# Patient Record
Sex: Female | Born: 1980 | Race: Black or African American | Hispanic: No
Health system: Southern US, Community
[De-identification: ages and names within clinical notes are randomized; demographics above are authoritative.]

## PROBLEM LIST (undated history)

## (undated) DIAGNOSIS — D649 Anemia, unspecified: Secondary | ICD-10-CM

## (undated) DIAGNOSIS — Z8639 Personal history of other endocrine, nutritional and metabolic disease: Secondary | ICD-10-CM

## (undated) HISTORY — PX: DILATION AND CURETTAGE OF UTERUS: SHX78

---
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
## 2008-03-20 ENCOUNTER — Inpatient Hospital Stay (HOSPITAL_COMMUNITY): Admission: AD | Admit: 2008-03-20 | Discharge: 2008-03-24 | Payer: Self-pay | Admitting: Obstetrics and Gynecology

## 2008-03-21 ENCOUNTER — Encounter (INDEPENDENT_AMBULATORY_CARE_PROVIDER_SITE_OTHER): Payer: Self-pay | Admitting: Obstetrics and Gynecology

## 2008-03-30 ENCOUNTER — Inpatient Hospital Stay (HOSPITAL_COMMUNITY): Admission: AD | Admit: 2008-03-30 | Discharge: 2008-03-30 | Payer: Self-pay | Admitting: Obstetrics and Gynecology

## 2008-08-25 IMAGING — US US PELVIS COMPLETE
1 series · 14 of 25 positions shown · non-contrast
Comparison: None

CLINICAL DATA: Cesarean section [DATE].  Vaginal bleeding.

TRANSABDOMINAL AND TRANSVAGINAL ULTRASOUND OF PELVIS
TECHNIQUE: Both transabdominal and transvaginal ultrasound
examinations of the pelvis were performed including evaluation of
the uterus, ovaries, adnexal regions, and pelvic cul-de-sac.

[Series 1: us pelvis complete · 0.28mm/px · 14 of 37 slices shown]
[im 1/37]
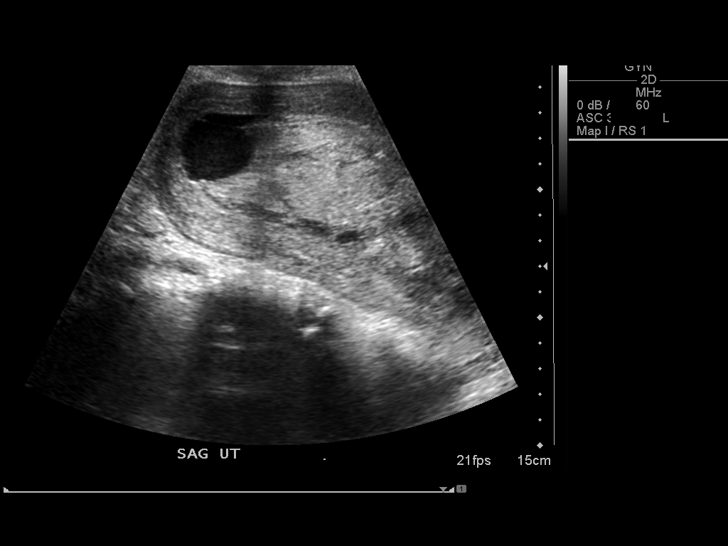
[im 4/37]
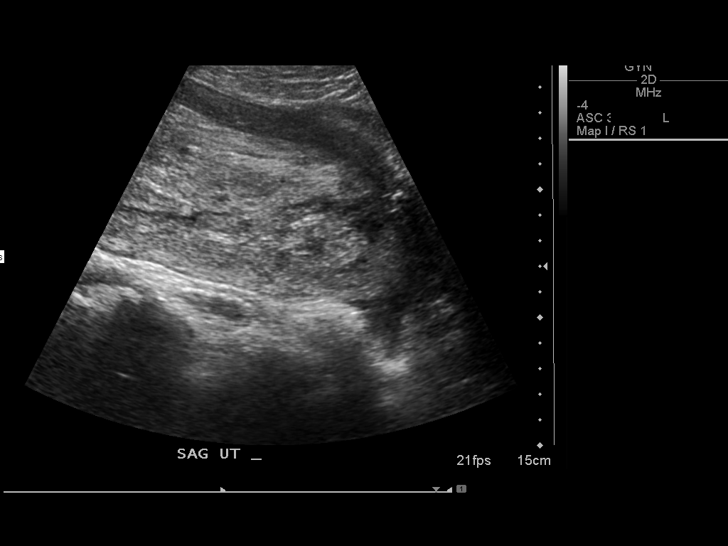
[im 7/37]
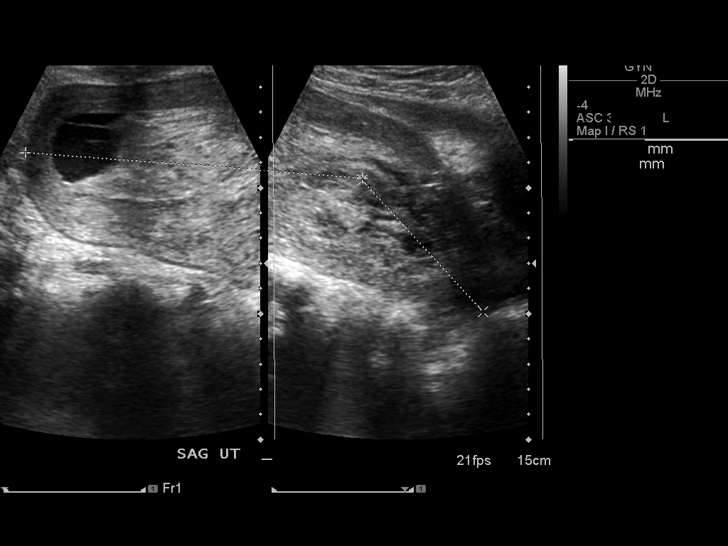
[im 10/37]
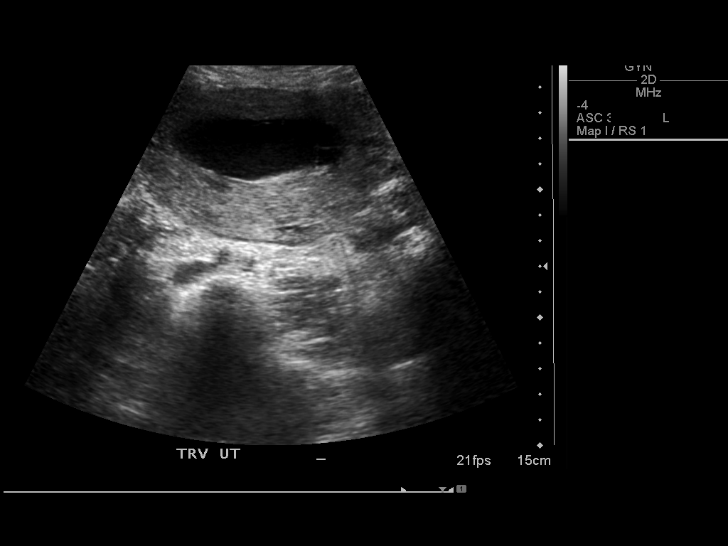
[im 13/37]
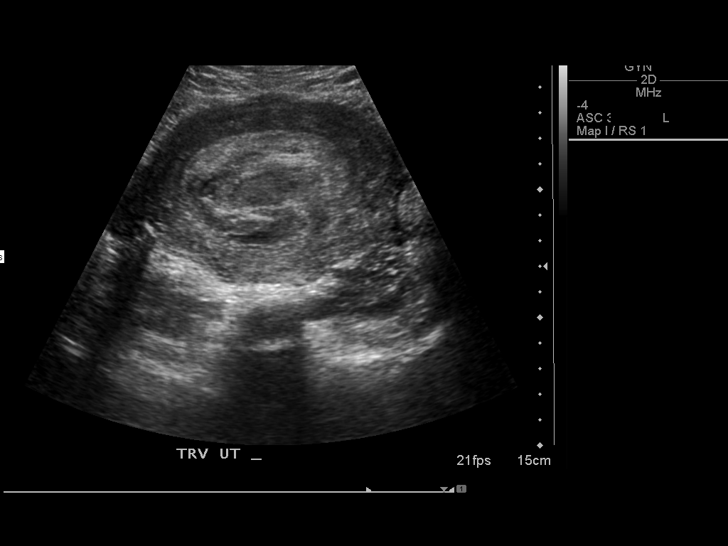
[im 14/37]
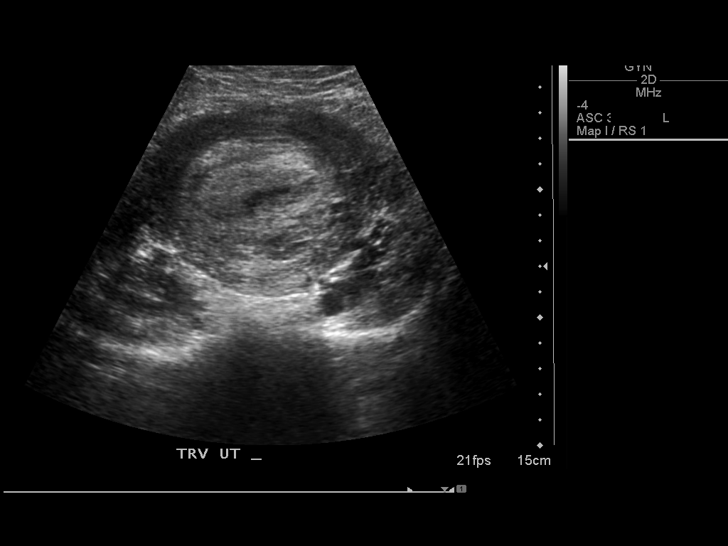
[im 17/37]
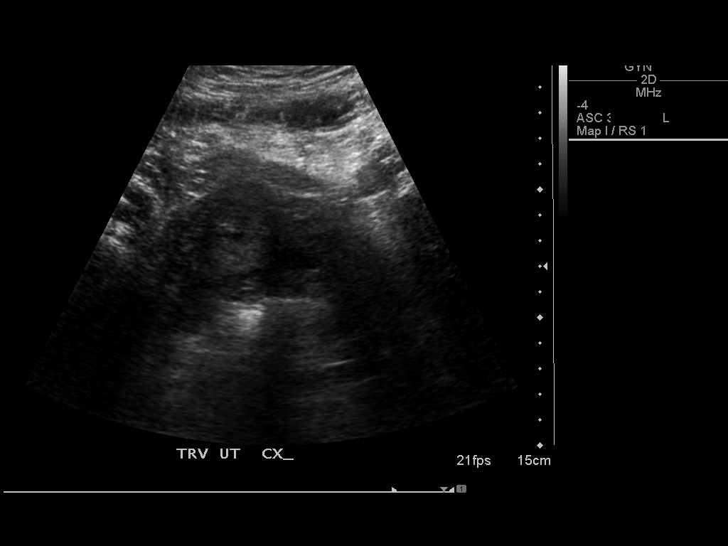
[im 20/37]
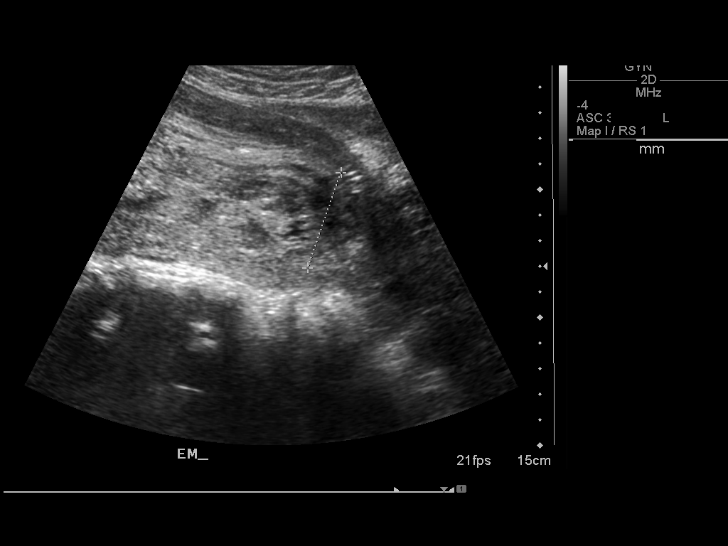
[im 23/37]
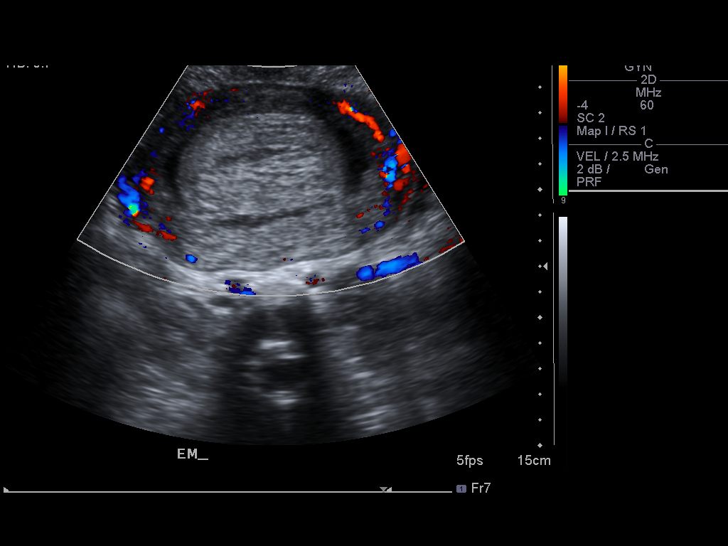
[im 25/37]
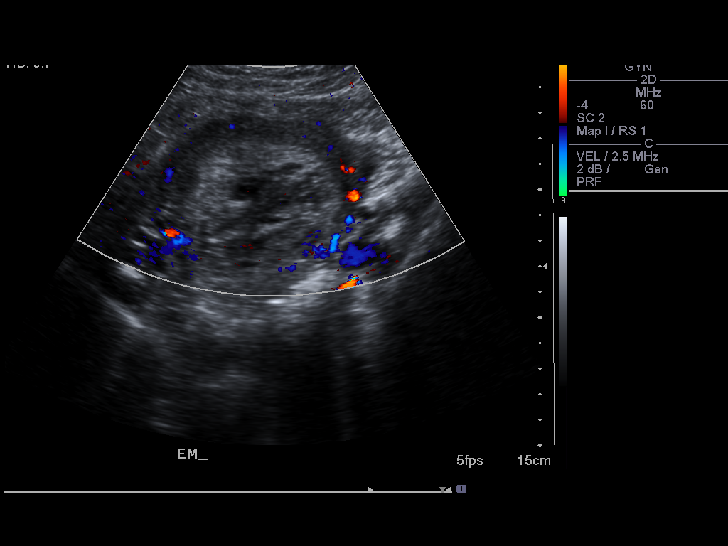
[im 28/37]
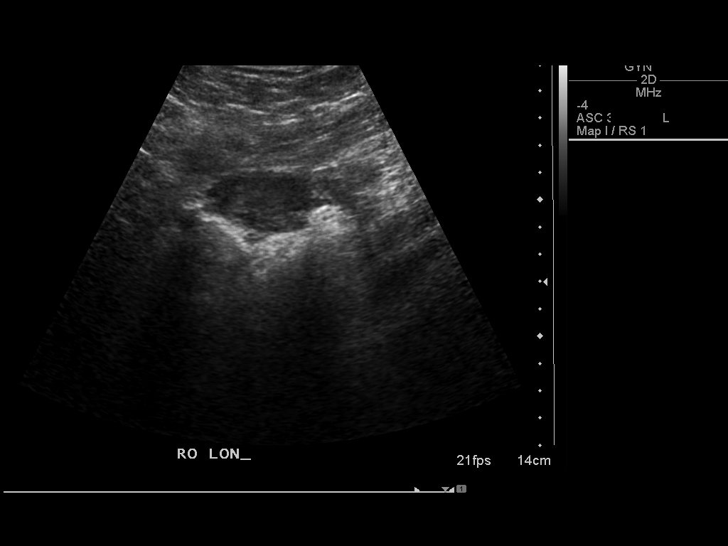
[im 31/37]
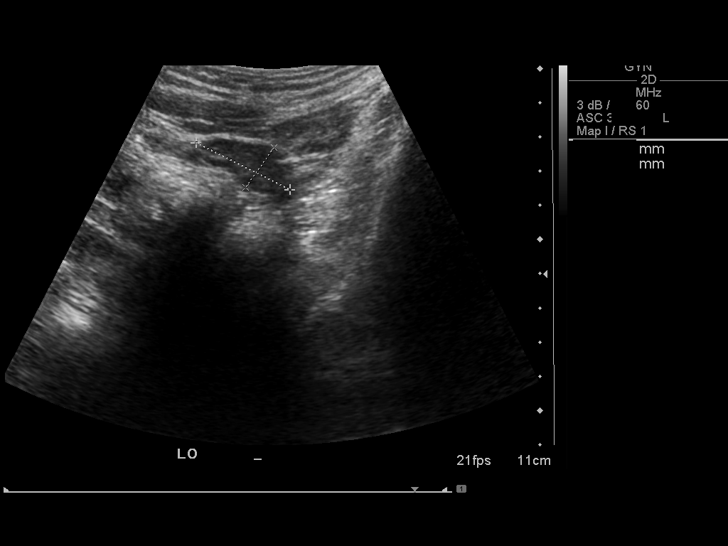
[im 34/37]
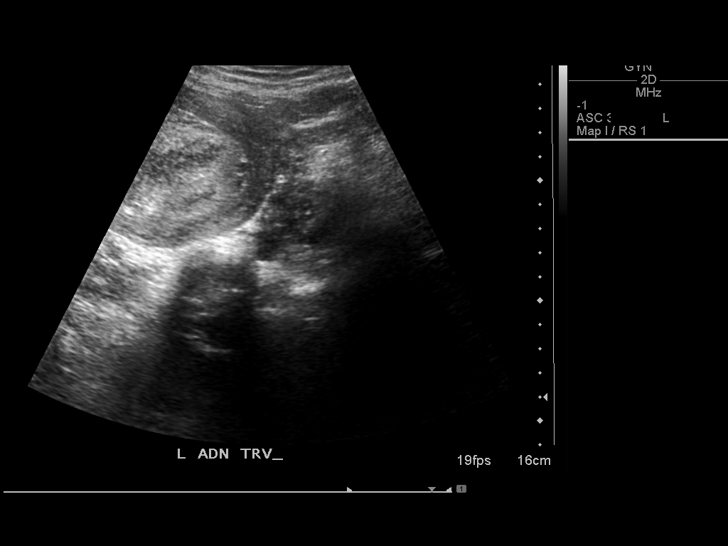
[im 37/37]
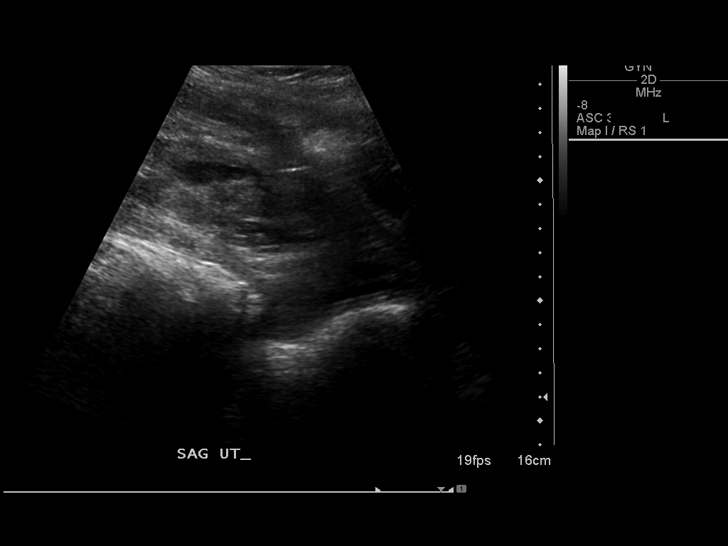

[14 of 25 positions shown; findings below may reference images not displayed]

FINDINGS: Uterus is enlarged measuring 20.4 x 6.4  x  9.8 cm.
Within the uterus is a approximately 4.8 cm thickness heterogeneous
avascular structure consistent with hematoma.  This has more
liquefied components superiorly.  No Doppler signal or movement to
suggest retained fetal parts.

Right ovary 3.8 x 2.3 x 3.2 cm.  Left ovary 3.1 x 1.4 x 2.1 cm.
Normal ovarian morphology without significant free fluid.
IMPRESSION: 1.  Large amount of residual hematoma within the uterus.  No
specific evidence to suggest retained products of conception.

## 2011-04-20 NOTE — Op Note (Signed)
Dorothy Logan, Dorothy Logan               ACCOUNT NO.:  [id number]   MEDICAL RECORD NO.:  [id number]          PATIENT TYPE:  INP   LOCATION:  9104                          FACILITY:  [facility]   PHYSICIAN:  Maxie Better, M.D.DATE OF BIRTH:  03/30/1981   DATE OF PROCEDURE:  03/21/2008  DATE OF DISCHARGE:                               OPERATIVE REPORT   PREOPERATIVE DIAGNOSIS:  Nonreassuring fetal tracing, post dates.   PROCEDURE:  Urgent primary cesarean section, Kerr hysterotomy.   POSTOPERATIVE DIAGNOSIS:  Nonreassuring fetal tracing, post dates.   ANESTHESIA:  Epidural.   SURGEON:  Maxie Better, M.D.   ASSISTANT:  None.   DESCRIPTION OF PROCEDURE:  Under adequate epidural anesthesia, the  patient is placed in the supine position with a left lateral tilt.  She  was quickly prepped and draped in the usual fashion.  An indwelling  Foley catheter was already in place.  0.25% Marcaine was injected along  the planned Pfannenstiel skin incision site and a skin incision was then  made and carried down to the rectus fascia.  The rectus fascia was then  opened transversely.  The rectus fascia was then bluntly and sharply  dissected off the rectus muscle in a superior and inferior fashion.  The  rectus muscles were split in the midline.  The parietal peritoneum was  entered bluntly and extended. The vesicouterine peritoneum was opened  transversely.  The lower uterine segment was well developed.  A low  transverse uterine incision was then made and extended with bandage  scissors.  Subsequent delivery of a live female via the assistance of  vacuum due to the large head was delivered.  The baby had a cord around  the neck x2 which were reduced.  The cord was clamped, cut, and the baby  was transferred to the awaiting pediatrician who assigned Apgars of 9  and 9 at 1 and 5 minutes.  The placenta had cord pH obtained which was  ultimately 7.27.  The placenta was removed and sent to pathology.   The  uterine cavity was cleaned of debris.  The uterine incision had no  extension.  The uterine incision was closed in two layers, the first  layer 0 Monocryl running locked stitch, the second layer imbricated  using 0 Monocryl suture.  The abdomen was then copiously irrigated and  suctioned of debris.  Normal tubes and ovaries were noted bilaterally.  The parietal peritoneum was then closed with 2-0 Vicryl, the rectus  fascia was closed with 0 Vicryl x2, the subcutaneous layers were  approximated with 2-0 plain sutures, and the skin approximated with  Ethicon staples.  The placenta was sent to pathology.  Estimated blood  loss was 600 mL.  Intraoperative fluid was 400 mL. Urine output was 50  mL.  Sponge and instrument counts x2 was correct.  Complications none.  Weight of the baby was 8 pounds 7 ounces.  The patient tolerated the  procedure well and was transferred to recovery in stable condition.     Maxie Better, M.D.  Electronically Signed    [place]/MEDQ  D:  03/21/2008  T:  03/21/2008  Job:  [id number]

## 2011-04-20 NOTE — Consult Note (Signed)
Dorothy, Logan               ACCOUNT NO.:  [id number]   MEDICAL RECORD NO.:  [id number]          PATIENT TYPE:  MAT   LOCATION:  MATC                          FACILITY:  [facility]   PHYSICIAN:  Lenoard Aden, M.D.DATE OF BIRTH:  [birth year]   DATE OF CONSULTATION:  03/30/2008  DATE OF DISCHARGE:                                 CONSULTATION   CHIEF COMPLAINT:  Increased bleeding.   She is a 30 year old female status post C-section March 21, 2008,  without complications.  Discharged home with a hemoglobin of a little  over 6 who presents with increased bleeding today.  She has allergy to  DEMEROL.  Medications are prenatal vitamins.  Family history of  migraine, alcohol abuse, breast cancer.  She is a nonsmoker, nondrinker,  denies domestic or physical violence.  She has had no other further  medical problems or hospitalization.  She is currently on an iron  supplement as well.   PHYSICAL EXAM:  She is a well-developed, well-nourished female in no  acute distress.  HEENT:  Normal.  LUNGS:  Clear.  HEART:  Regular rhythm.  ABDOMEN:  Soft, nontender.  Incision healing well.  PELVIC EXAM:  Reveals minimal active bleeding.  No adnexal masses.  EXTREMITIES:  Reveal no cords.  NEUROLOGIC:  Nonfocal.  SKIN:  Intact.   Ultrasound revealing no evidence of retained products, normal post  gravid uterus, no adnexal masses.  Hemoglobin today 7.8, up from  hospital discharge.   IMPRESSION:  Postpartum bleeding within normal limits.   PLAN:  Treat with Keflex 500 t.i.d. x7 days due to slightly increased  tenderness.  Will continue iron therapy and give Methergine 0.2 mg p.o.  q.4h. x6 doses.  Follow up in the office in 2-3 days.  Bleeding  precautions given.      Lenoard Aden, M.D.  Electronically Signed     [facility]/MEDQ  D:  03/30/2008  T:  03/30/2008  Job:  [id number]

## 2011-04-23 NOTE — Discharge Summary (Signed)
Dorothy Logan, Dorothy Logan               ACCOUNT NO.:  [id number]   MEDICAL RECORD NO.:  [id number]          PATIENT TYPE:  INP   LOCATION:  9104                          [facility]ILITY:  [facility]   PHYSICIAN:  Maxie Better, M.D.DATE OF BIRTH:  [birth year]   DATE OF ADMISSION:  03/20/2008  DATE OF DISCHARGE:  03/24/2008                               DISCHARGE SUMMARY   ADMISSION DIAGNOSIS:  Postdates.   DISCHARGE DIAGNOSES:  Postdates delivered, nonreassuring fetal tracing,  status post primary cesarean section.   PROCEDURE:  Primary cesarean section.   HISTORY OF PRESENT ILLNESS:  This is a patient of Dr. Ernestina Penna who is 30  years old admitted for induction of labor due to postdates.  The patient  had Cytotec for cervical ripening.  On March 21, 2008, she had  spontaneous rupture of membranes, meconium fluid.  At that time, her  exam was 3, 80%, minus 2.  An intrauterine pressure catheter was placed.  Tracing showed a baseline of 140 small variable to regular contractions  and infusion was started as was a low-dose Pitocin.  The patient began  having decelerations down to the 70s after each contractions and the  infusion was discontinued.  Pitocin was placed on hold.  Her exam at  that time was 4, 80, minus 1.  She had light appearing meconium.  Review  of the tracing showed some variable decelerations with some late  components continued, fetal heart rate down to the 70s lasting 60-70  seconds.  She was contracting every 3 to 4-1/2 minutes on her own.  We  felt it was variable deceleration secondary to cord compression.  The  patient subsequently had repetitive late variable decelerations with no  marked cervical change.  Given the findings, a primary cesarean section  was recommended and the patient was taken to the operating room for her  surgery.  She underwent a primary cesarean section, a live female, cord  around the neck x2.  Apgars of 9 and 9, cord pH of 7.27, 8 pounds 7  ounces,  normal tubes and ovaries.  Placenta was sent to pathology.  Her  postoperative course was notable for postoperative anemia due to acute  blood loss.   Her CBC on postop day #1 showed a hemoglobin of 6.3, hematocrit of 19.8,  white count of 9.2, platelet count of 155,000.  Please note that the  patient started with a hemoglobin of 9.9, hematocrit of 31, white count  of 6.8, and platelet count of 299.  The patient was carefully checked to  being symptomatic from her blood loss.  The patient had reported some  lightheadedness on postop day #2.  She also complained of migraine  headache, the patient with known history of migraines.  She had an  anemia workup due to the fact that her hemoglobin was 9.9 on admission  and her MCV was 65.  Hemoglobin electrophoresis was performed.  A  combination of iron deficiency and possible beta thal was entertained.  Ferritin level was 19.  The patient though slightly symptomatic declined  a blood transfusion.  She was given medication  for her migraine.  She  was continued on iron supplementation three times a day.  By postop day  #3, the patient denied any lightheadedness.  She was afebrile.  Her  vitals were stable.  She had a grade 2/6 systolic ejection murmur noted.  Her incision had no evidence of an infection.  No hematoma noted.  Her  lochia was moderate.  She was deemed well to be discharged home.   DISPOSITION:  Home.   CONDITION:  Stable.   DISCHARGE MEDICATIONS:  1. Niferex 150 mg 1 p.o. b.i.d.  2. Tylox #30 1-2 tablets every 3-4 hours p.r.n. pain.  3. Motrin 800 mg 1 p.o. q.6 h. p.r.n. pain.  4. Prenatal vitamins 1 p.o. daily.   FOLLOWUP:  Followup appointment at [facility] in 4-6 weeks.   DISCHARGE INSTRUCTIONS:  Per the postpartum booklet given.      Maxie Better, M.D.  Electronically Signed     [place]/MEDQ  D:  04/24/2008  T:  04/24/2008  Job:  [id number]

## 2011-08-31 LAB — RPR: RPR Ser Ql: NONREACTIVE

## 2011-08-31 LAB — CBC
HCT: 24.2 — ABNORMAL LOW
HCT: 31 — ABNORMAL LOW
Hemoglobin: 6.7 — CL
Hemoglobin: 9.9 — ABNORMAL LOW
MCHC: 32.1
MCV: 65.1 — ABNORMAL LOW
Platelets: 155
Platelets: 299
Platelets: 463 — ABNORMAL HIGH
RBC: 3.23 — ABNORMAL LOW
RBC: 4.76
RDW: 23 — ABNORMAL HIGH
RDW: 23.1 — ABNORMAL HIGH
RDW: 28.1 — ABNORMAL HIGH
WBC: 6.8

## 2011-08-31 LAB — FERRITIN: Ferritin: 19 (ref 10–291)

## 2011-08-31 LAB — HEMOGLOBINOPATHY EVALUATION
Hemoglobin Other: 0 (ref 0.0–0.0)
Hgb F Quant: 0 (ref 0.0–2.0)
Hgb S Quant: 0 % (ref 0.0–0.0)

## 2019-10-03 ENCOUNTER — Other Ambulatory Visit: Payer: Self-pay

## 2019-10-03 DIAGNOSIS — Z20822 Contact with and (suspected) exposure to covid-19: Secondary | ICD-10-CM

## 2019-10-04 LAB — NOVEL CORONAVIRUS, NAA: SARS-CoV-2, NAA: NOT DETECTED

## 2022-08-18 ENCOUNTER — Other Ambulatory Visit: Payer: Self-pay | Admitting: Family Medicine

## 2022-08-18 DIAGNOSIS — Z1231 Encounter for screening mammogram for malignant neoplasm of breast: Secondary | ICD-10-CM

## 2022-08-23 ENCOUNTER — Ambulatory Visit
Admission: RE | Admit: 2022-08-23 | Discharge: 2022-08-23 | Disposition: A | Discharge status: Home / Self Care | Payer: Self-pay | Source: Ambulatory Visit | Attending: Family Medicine | Admitting: Family Medicine

## 2022-08-23 DIAGNOSIS — Z1231 Encounter for screening mammogram for malignant neoplasm of breast: Secondary | ICD-10-CM

## 2022-11-20 ENCOUNTER — Emergency Department (HOSPITAL_COMMUNITY)
Admission: EM | Admit: 2022-11-20 | Discharge: 2022-11-20 | Disposition: A | Discharge status: Home / Self Care | Attending: Emergency Medicine | Admitting: Emergency Medicine

## 2022-11-20 ENCOUNTER — Other Ambulatory Visit: Payer: Self-pay

## 2022-11-20 DIAGNOSIS — O034 Incomplete spontaneous abortion without complication: Secondary | ICD-10-CM | POA: Diagnosis not present

## 2022-11-20 DIAGNOSIS — O209 Hemorrhage in early pregnancy, unspecified: Secondary | ICD-10-CM | POA: Diagnosis present

## 2022-11-20 DIAGNOSIS — Z3A00 Weeks of gestation of pregnancy not specified: Secondary | ICD-10-CM | POA: Insufficient documentation

## 2022-11-20 LAB — BASIC METABOLIC PANEL
Anion gap: 6 (ref 5–15)
BUN: 9 mg/dL (ref 6–20)
CO2: 21 mmol/L — ABNORMAL LOW (ref 22–32)
Calcium: 9.2 mg/dL (ref 8.9–10.3)
Chloride: 107 mmol/L (ref 98–111)
Creatinine, Ser: 0.67 mg/dL (ref 0.44–1.00)
GFR, Estimated: >60 mL/min (ref >60)
Glucose, Bld: 85 mg/dL (ref 70–99)
Potassium: 3.6 mmol/L (ref 3.5–5.1)
Sodium: 134 mmol/L — ABNORMAL LOW (ref 135–145)

## 2022-11-20 LAB — CBC WITH DIFFERENTIAL/PLATELET
Abs Immature Granulocytes: 0.04 10*3/uL (ref 0.00–0.07)
Basophils Absolute: 0 10*3/uL (ref 0.0–0.1)
Basophils Relative: 0 %
Eosinophils Absolute: 0 10*3/uL (ref 0.0–0.5)
Eosinophils Relative: 0 %
HCT: 36.7 % (ref 36.0–46.0)
Hemoglobin: 11.8 g/dL — ABNORMAL LOW (ref 12.0–15.0)
Immature Granulocytes: 0 %
Lymphocytes Relative: 39 %
Lymphs Abs: 3.7 10*3/uL (ref 0.7–4.0)
MCH: 25.8 pg — ABNORMAL LOW (ref 26.0–34.0)
MCHC: 32.2 g/dL (ref 30.0–36.0)
MCV: 80.1 fL (ref 80.0–100.0)
Monocytes Absolute: 0.6 10*3/uL (ref 0.1–1.0)
Monocytes Relative: 7 %
Neutro Abs: 5 10*3/uL (ref 1.7–7.7)
Neutrophils Relative %: 54 %
Platelets: 293 10*3/uL (ref 150–400)
RBC: 4.58 MIL/uL (ref 3.87–5.11)
RDW: 15.6 % — ABNORMAL HIGH (ref 11.5–15.5)
WBC: 9.5 10*3/uL (ref 4.0–10.5)
nRBC: 0 % (ref 0.0–0.2)

## 2022-11-20 LAB — TYPE AND SCREEN
ABO/RH(D): O POS
Antibody Screen: NEGATIVE

## 2022-11-20 LAB — ABO/RH: ABO/RH(D): O POS

## 2022-11-20 LAB — HCG, QUANTITATIVE, PREGNANCY: hCG, Beta Chain, Quant, S: 36308 m[IU]/mL — ABNORMAL HIGH (ref <5)

## 2022-11-20 MED ORDER — SODIUM CHLORIDE 0.9 % IV BOLUS
1000.0000 mL | Freq: Once | INTRAVENOUS | Status: AC
  Administered 2022-11-20: 1000 mL via INTRAVENOUS

## 2022-11-20 NOTE — ED Triage Notes (Signed)
Came here from home. Uncle drove her in. She was bleeding last Tuesday and was told it was a miscarriage. She has had a lot of bleeding. Bleeding through 2 pads and large blood clots within 10 minutes.

## 2022-11-20 NOTE — ED Notes (Signed)
Assisted PA in pelvic exam.  Pt is alert and oriented.  She does have some blood clots but there is no gushing and such.  Pt indicated some cramping but

## 2022-11-20 NOTE — Discharge Instructions (Signed)
You were seen in the emergency department for vaginal bleeding related to a likely spontaneous miscarriage. Please follow-up for your scheduled ultrasound in 10 days. Please return to the emergency department for significant increase in bleeding with lightheadedness.

## 2022-11-20 NOTE — ED Provider Notes (Signed)
[facility]-EMERGENCY DEPT Provider Note   CSN: [id number] Arrival date & time: 11/20/22  1807     History  Chief Complaint  Patient presents with   Vaginal Bleeding    Dorothy Logan is a 41 y.o. female. With no documented past medical history who presents to the emergency department with vaginal bleeding.   States she recently found out she was pregnant. She began having right sided abdominal cramping about 5 days ago. She was seen at the [facility] on 11/16/22. She had ultrasound which demonstrated no yolk sac and this was presumed to be likely spontaneous abortion. She was to have follow-up ultrasound after Christmas for re-evaluation but began having worsening bleeding today. She states this morning she passed a large clot and then has had ongoing bleeding with mild lightheadedness and feels anxious.   On review - appears her hemoglobin at [facility] was 12.1. She is Rh+. No hCG value taken at visit.   HPI     Home Medications Prior to Admission medications   Not on File      Allergies    Patient has no allergy information on record.    Review of Systems   Review of Systems  Genitourinary:  Positive for pelvic pain and vaginal bleeding.  All other systems reviewed and are negative.   Physical Exam Updated Vital Signs There were no vitals taken for this visit. Physical Exam Vitals and nursing note reviewed.  HENT:     Head: Normocephalic and atraumatic.  Eyes:     General: No scleral icterus.    Extraocular Movements: Extraocular movements intact.  Pulmonary:     Effort: Pulmonary effort is normal. No respiratory distress.  Abdominal:     Palpations: Abdomen is soft.  Musculoskeletal:        General: Normal range of motion.  Skin:    General: Skin is warm and dry.     Findings: No rash.  Neurological:     General: No focal deficit present.     Mental Status: She is alert and oriented to person, place, and time. Mental status is at baseline.   Psychiatric:        Mood and Affect: Mood normal.        Behavior: Behavior normal.        Thought Content: Thought content normal.        Judgment: Judgment normal.     ED Results / Procedures / Treatments   Labs (all labs ordered are listed, but only abnormal results are displayed) Labs Reviewed  BASIC METABOLIC PANEL  CBC WITH DIFFERENTIAL/PLATELET  HCG, QUANTITATIVE, PREGNANCY  TYPE AND SCREEN    EKG None  Radiology No results found.  Procedures Procedures   Medications Ordered in ED Medications  sodium chloride 0.9 % bolus 1,000 mL (has no administration in time range)    ED Course/ Medical Decision Making/ A&P                           Medical Decision Making Initial Impression and Ddx 41 year old female who presents with increased vaginal bleeding with likely spontaneous abortion. She is alert, oriented on exam. Mentating well with a soft abdomen. She is having bleeding that is obvious on the bed pads. Will obtain blood counts to ensure no acute anemia/hemorrhage. Will order some IVF as she is feeling lightheaded. Patient PMH that increases complexity of ED encounter:  none  Interpretation of Diagnostics I independent reviewed  and interpreted the labs as followed: ***  - I independently visualized the following imaging with scope of interpretation limited to determining acute life threatening conditions related to emergency care: ***, which revealed ***  Patient Reassessment and Ultimate Disposition/Management ***  Patient management required discussion with the following services or consulting groups:  {BEROCONSULT:26841}  Complexity of Problems Addressed {BEROCOPA:26833}  Additional Data Reviewed and Analyzed Further history obtained from: {BERODATA:26834}  Patient Encounter Risk Assessment {BERORISK:26838}  Final Clinical Impression(s) / ED Diagnoses Final diagnoses:  None    Rx / DC Orders ED Discharge Orders     None

## 2023-12-20 ENCOUNTER — Telehealth (INDEPENDENT_AMBULATORY_CARE_PROVIDER_SITE_OTHER): Payer: Self-pay | Admitting: Otolaryngology

## 2023-12-20 NOTE — Telephone Encounter (Signed)
 Reminder Call:  Date: 12/21/2023 Status: Sch  Time: 8:30 AM [address] [place] [address] Confirmed w/patient

## 2023-12-21 ENCOUNTER — Ambulatory Visit (INDEPENDENT_AMBULATORY_CARE_PROVIDER_SITE_OTHER): Admitting: Otolaryngology

## 2023-12-21 ENCOUNTER — Encounter (INDEPENDENT_AMBULATORY_CARE_PROVIDER_SITE_OTHER): Payer: Self-pay

## 2023-12-21 VITALS — BP 116/77 | HR 74 | Resp 19

## 2023-12-21 DIAGNOSIS — E041 Nontoxic single thyroid nodule: Secondary | ICD-10-CM | POA: Diagnosis not present

## 2023-12-21 DIAGNOSIS — E049 Nontoxic goiter, unspecified: Secondary | ICD-10-CM

## 2023-12-21 NOTE — Progress Notes (Signed)
 Dear Dr. Jearld Min, Here is my assessment for our mutual patient, Dorothy Logan. Thank you for allowing me the opportunity to care for your patient. Please do not hesitate to contact me should you have any other questions. Sincerely, Dr. Milon Aloe  Otolaryngology Clinic Note Referring provider: Dr. Jearld Min HPI:  Dorothy Logan is a 43 y.o. female kindly referred by Dr. Jearld Min for evaluation of thyroid  nodule and goiter.   Initial visit (12/2023): First noted Goiter last year Enlarging: yes, grown since last year Patient reports: she feels it more (like a lump in her throat with a little pressure when head is down or clothing is covering that portion in the neck) Patient denies: any other issues with breathing, swallowing, voice change Compressive symptoms: denies Hypo or hyperthyroid symptoms: no (no diarrhea, cold/heat intolerance, not losing her hair) Tobacco: no  Prior evaluation has included: labs (normal), US  (2023, early 2024, and then again Dec 2024 - [facility] [place]), Biopsy (also same place) was done prior and noted to be benign  History of radiation to H&N: no Family history of thyroid  cancer: Dad had hyperthyroidism; no thyroid  cancer  H&N Surgery: no Personal or FHx of bleeding dz or anesthesia difficulty: no  PMHx: otherwise healthy  GLP-1: no AP/AC: no  Lives in [place], [place]  Independent Review of Additional Tests or Records:  Riley Cheadle (PA-C) 11/23/2023: noted thyroid  nodule, neck lump and fullness; prior has had thyroid  US , FNA. No pain, dysphagia, hoarseness but reports it has grown over last year. Rx: Ref to ENT  Dr. Gwendalyn Lemma (ENT) 02/16/2023 and Dr. Roselle Conner 11/10/2022 : 43 yo with thyroid  nodule, enlarged; prior bx, benign, normal labs; noted right TR4 US , left 1.2 cm; Recheck 12 months  TSH 11/11/2023: wnl (0.559)  US  Thyroid  (02/09/2023 and 2023): reviewed independently and agree with read  US  Thyroid  11/2023 reviewed independently and agree  with read - harder to spot left thyroid  nodule, but likely present at well; heterogenous, right nodule well demarcated and no microcal   Thy FNA/Bx 2023: unable to review path, but per prior ENT notes noted benign  PMH/Meds/All/SocHx/FamHx/ROS:  History reviewed. No pertinent past medical history.   History reviewed. No pertinent surgical history.  Family History  Problem Relation Age of Onset   Breast cancer Maternal Grandmother    Breast cancer Other      Social Connections: Unknown (10/12/2022)   Received from [facility]   Social Network    Social Network: Not on file      Current Outpatient Medications:    ascorbic acid (VITAMIN C) 500 MG tablet, Take 500 mg by mouth daily., Disp: , Rfl:    Physical Exam:   BP 116/77 (BP Location: Left Arm, Patient Position: Sitting, Cuff Size: Normal)   Pulse 74   Resp 19   SpO2 98%   Salient findings:  CN II-XII intact Bilateral EAC clear and TM intact with well pneumatized middle ear spaces Anterior rhinoscopy: Septum relatively midline; bilateral inferior turbinates without significant hypertrophy No lesions of oral cavity/oropharynx; dentition good No obviously palpable neck masses/lymphadenopathy; diffuse right lobe enlargement, soft, non-tender, mobile; left lobe does not appear enlarged; TFL was indicated to better evaluate the vocal fold function, given the patient's history and exam findings, and is detailed below No respiratory distress or stridor; voice quality class 1.5  Seprately Identifiable Procedures:  Procedure Note Pre-procedure diagnosis: Thyroid  nodule, need for vocal fold mobility examination Post-procedure diagnosis: Same Procedure: Transnasal Fiberoptic Laryngoscopy, CPT 31575 - Mod  25 Indication: see above Complications: None apparent EBL: 0 mL  The procedure was undertaken to further evaluate the patient's complaint of thyroid  nodule, need for vocal fold examination, with mirror exam  inadequate for appropriate examination due to gag reflex and poor patient tolerance  Procedure:  Patient was identified as correct patient. Verbal consent was obtained. The nose was sprayed with oxymetazoline and 4% lidocaine . The The flexible laryngoscope was passed through the nose to view the nasal cavity, pharynx (oropharynx, hypopharynx) and larynx.  The larynx was examined at rest and during multiple phonatory tasks. Documentation was obtained and reviewed with patient. The scope was removed. The patient tolerated the procedure well.  Findings: The nasal cavity and nasopharynx did not reveal any masses or lesions, mucosa appeared to be without obvious lesions. The tongue base, pharyngeal walls, piriform sinuses, vallecula, epiglottis and postcricoid region are normal in appearance. The visualized portion of the subglottis and proximal trachea is widely patent. The vocal folds are mobile bilaterally. There are no lesions on the free edge of the vocal folds nor elsewhere in the larynx worrisome for malignancy.      Electronically signed by: Evelina Hippo, MD 12/21/2023 1:36 PM  Impression & Plans:  Dorothy Logan is a 43 y.o. female with:  1. Thyroid  nodule   2. Goiter    She has a right thyroid  nodule which is > 4cm and grown. Prior biopsy was benign. TFL shows mobile cords, and she is not having significant symptoms except for vague noticing it during certain movements. We discussed options: 1. Observation w/rpt US  2. Repeat FNA 3. Right lobectomy Thyroid  is heterogenous but given no significantly large nodule on left, would leave left lobe alone  Risks of thyroidectomy were discussed including pain, bleeding, infection, need for drain placement (may not do it depending on hemostasis), injury to recurrent laryngeal nerve including vocal fold paralysis (permanent <3%) and hoarseness, lack of improvement, need for further procedures, change in swallowing (unlikely), hypocalcemia (unlikely  given unilateral lobectomy), injury to trachea or surrounding great vessels including pulmonary complications, and discovery of malignancy and possible need for thyroid  supplementation (<15%). Patient understands these risks, and despite these risks, wishes to proceed with thyroid  lobectomy  - Will schedule her for Right thyroid  Lobectomy with nerve monitoring - f/u POD ~5 - Will request FNA records  See below regarding exact medications prescribed this encounter including dosages and route: No orders of the defined types were placed in this encounter.     Thank you for allowing me the opportunity to care for your patient. Please do not hesitate to contact me should you have any other questions.  Sincerely, Milon Aloe, MD Otolarynoglogist (ENT), [facility] Phone: [phone] Fax: [phone]  12/21/2023, 1:36 PM   MDM:  Level 4 Complexity/Problems addressed: mod - chronic problem, worsening Data complexity: mod - independent review of notes, labs, and independent interpretation and review of imaging (thyroid  US ) - Morbidity: mod - decision for surgery  - Prescription Drug prescribed or managed: no

## 2024-01-06 ENCOUNTER — Encounter (INDEPENDENT_AMBULATORY_CARE_PROVIDER_SITE_OTHER)

## 2024-01-09 NOTE — Pre-Procedure Instructions (Signed)
 Surgical Instructions   Your procedure is scheduled on January 16, 2024. Report to [facility] Main Entrance A at 12:30 P.M., then check in with the Admitting office. Any questions or running late day of surgery: call [phone]  Questions prior to your surgery date: call [phone], Monday-Friday, 8am-4pm. If you experience any cold or flu symptoms such as cough, fever, chills, shortness of breath, etc. between now and your scheduled surgery, please notify us  at the above number.     Remember:  Do not eat after midnight the night before your surgery   You may drink clear liquids until 11:30AM the morning of your surgery.   Clear liquids allowed are: Water, Non-Citrus Juices (without pulp), Carbonated Beverages, Clear Tea (no milk, honey, etc.), Black Coffee Only (NO MILK, CREAM OR POWDERED CREAMER of any kind), and Gatorade.    Take these medicines the morning of surgery with A SIP OF WATER: NONE   May take these medicines IF NEEDED: diphenhydrAMINE  (BENADRYL )  acetaminophen  (TYLENOL )   One week prior to surgery, STOP taking any Aspirin (unless otherwise instructed by your surgeon) Aleve, Naproxen, Ibuprofen , Motrin , Advil , Goody's, BC's, all herbal medications, fish oil, and non-prescription vitamins/supplements.                     Do NOT Smoke (Tobacco/Vaping) for 24 hours prior to your procedure.  If you use a CPAP at night, you may bring your mask/headgear for your overnight stay.   You will be asked to remove any contacts, glasses, piercing's, hearing aid's, dentures/partials prior to surgery. Please bring cases for these items if needed.    Patients discharged the day of surgery will not be allowed to drive home, and someone needs to stay with them for 24 hours.  SURGICAL WAITING ROOM VISITATION Patients may have no more than 2 support people in the waiting area - these visitors may rotate.   Pre-op nurse will coordinate an appropriate time for 1 ADULT support  person, who may not rotate, to accompany patient in pre-op.  Children under the age of 3 must have an adult with them who is not the patient and must remain in the main waiting area with an adult.  If the patient needs to stay at the hospital during part of their recovery, the visitor guidelines for inpatient rooms apply.  Please refer to the [facility] website for the visitor guidelines for any additional information.   If you received a COVID test during your pre-op visit  it is requested that you wear a mask when out in public, stay away from anyone that may not be feeling well and notify your surgeon if you develop symptoms. If you have been in contact with anyone that has tested positive in the last 10 days please notify you surgeon.      Pre-operative CHG Bathing Instructions   You can play a key role in reducing the risk of infection after surgery. Your skin needs to be as free of germs as possible. You can reduce the number of germs on your skin by washing with CHG (chlorhexidine gluconate) soap before surgery. CHG is an antiseptic soap that kills germs and continues to kill germs even after washing.   DO NOT use if you have an allergy to chlorhexidine/CHG or antibacterial soaps. If your skin becomes reddened or irritated, stop using the CHG and notify one of our RNs at [phone].  TAKE A SHOWER THE NIGHT BEFORE SURGERY AND THE DAY OF SURGERY    Please keep in mind the following:  DO NOT shave, including legs and underarms, 48 hours prior to surgery.   You may shave your face before/day of surgery.  Place clean sheets on your bed the night before surgery Use a clean washcloth (not used since being washed) for each shower. DO NOT sleep with pet's night before surgery.  CHG Shower Instructions:  Wash your face and private area with normal soap. If you choose to wash your hair, wash first with your normal shampoo.  After you use shampoo/soap, rinse your hair and  body thoroughly to remove shampoo/soap residue.  Turn the water OFF and apply half the bottle of CHG soap to a CLEAN washcloth.  Apply CHG soap ONLY FROM YOUR NECK DOWN TO YOUR TOES (washing for 3-5 minutes)  DO NOT use CHG soap on face, private areas, open wounds, or sores.  Pay special attention to the area where your surgery is being performed.  If you are having back surgery, having someone wash your back for you may be helpful. Wait 2 minutes after CHG soap is applied, then you may rinse off the CHG soap.  Pat dry with a clean towel  Put on clean pajamas    Additional instructions for the day of surgery: DO NOT APPLY any lotions, deodorants, cologne, or perfumes.   Do not wear jewelry or makeup Do not wear nail polish, gel polish, artificial nails, or any other type of covering on natural nails (fingers and toes) Do not bring valuables to the hospital. [facility] is not responsible for valuables/personal belongings. Put on clean/comfortable clothes.  Please brush your teeth.  Ask your nurse before applying any prescription medications to the skin.

## 2024-01-10 ENCOUNTER — Inpatient Hospital Stay (HOSPITAL_COMMUNITY)
Admission: RE | Admit: 2024-01-10 | Discharge: 2024-01-10 | Disposition: A | Discharge status: Home / Self Care | Source: Ambulatory Visit

## 2024-01-12 ENCOUNTER — Inpatient Hospital Stay (HOSPITAL_COMMUNITY)
Admission: RE | Admit: 2024-01-12 | Discharge: 2024-01-12 | Disposition: A | Discharge status: Home / Self Care | Source: Ambulatory Visit

## 2024-01-12 NOTE — Progress Notes (Signed)
 Pt states she will not be coming to her PAT visit today due to a "fever from the flu." Pt was advised to call her surgeon's office ASAP and let them know that she is sick.

## 2024-01-12 NOTE — Pre-Procedure Instructions (Signed)
 Surgical Instructions   Your procedure is scheduled on January 16, 2024. Report to [facility] Main Entrance A at 12:30 P.M., then check in with the Admitting office. Any questions or running late day of surgery: call [phone]  Questions prior to your surgery date: call [phone], Monday-Friday, 8am-4pm. If you experience any cold or flu symptoms such as cough, fever, chills, shortness of breath, etc. between now and your scheduled surgery, please notify us  at the above number.     Remember:  Do not eat or drink after midnight the night before your surgery     Take these medicines the morning of surgery with A SIP OF WATER: NONE   May take these medicines IF NEEDED: diphenhydrAMINE  (BENADRYL )  acetaminophen  (TYLENOL )   One week prior to surgery, STOP taking any Aspirin (unless otherwise instructed by your surgeon) Aleve, Naproxen, Ibuprofen , Motrin , Advil , Goody's, BC's, all herbal medications, fish oil, and non-prescription vitamins/supplements.                     Do NOT Smoke (Tobacco/Vaping) for 24 hours prior to your procedure.  If you use a CPAP at night, you may bring your mask/headgear for your overnight stay.   You will be asked to remove any contacts, glasses, piercing's, hearing aid's, dentures/partials prior to surgery. Please bring cases for these items if needed.    Patients discharged the day of surgery will not be allowed to drive home, and someone needs to stay with them for 24 hours.  SURGICAL WAITING ROOM VISITATION Patients may have no more than 2 support people in the waiting area - these visitors may rotate.   Pre-op nurse will coordinate an appropriate time for 1 ADULT support person, who may not rotate, to accompany patient in pre-op.  Children under the age of 78 must have an adult with them who is not the patient and must remain in the main waiting area with an adult.  If the patient needs to stay at the hospital during part of their recovery,  the visitor guidelines for inpatient rooms apply.  Please refer to the [facility] website for the visitor guidelines for any additional information.   If you received a COVID test during your pre-op visit  it is requested that you wear a mask when out in public, stay away from anyone that may not be feeling well and notify your surgeon if you develop symptoms. If you have been in contact with anyone that has tested positive in the last 10 days please notify you surgeon.      Pre-operative CHG Bathing Instructions   You can play a key role in reducing the risk of infection after surgery. Your skin needs to be as free of germs as possible. You can reduce the number of germs on your skin by washing with CHG (chlorhexidine gluconate) soap before surgery. CHG is an antiseptic soap that kills germs and continues to kill germs even after washing.   DO NOT use if you have an allergy to chlorhexidine/CHG or antibacterial soaps. If your skin becomes reddened or irritated, stop using the CHG and notify one of our RNs at [phone].              TAKE A SHOWER THE NIGHT BEFORE SURGERY AND THE DAY OF SURGERY    Please keep in mind the following:  DO NOT shave, including legs and underarms, 48 hours prior to surgery.   You may shave your face before/day of surgery.  Place clean sheets on your bed the night before surgery Use a clean washcloth (not used since being washed) for each shower. DO NOT sleep with pet's night before surgery.  CHG Shower Instructions:  Wash your face and private area with normal soap. If you choose to wash your hair, wash first with your normal shampoo.  After you use shampoo/soap, rinse your hair and body thoroughly to remove shampoo/soap residue.  Turn the water OFF and apply half the bottle of CHG soap to a CLEAN washcloth.  Apply CHG soap ONLY FROM YOUR NECK DOWN TO YOUR TOES (washing for 3-5 minutes)  DO NOT use CHG soap on face, private areas, open wounds, or sores.   Pay special attention to the area where your surgery is being performed.  If you are having back surgery, having someone wash your back for you may be helpful. Wait 2 minutes after CHG soap is applied, then you may rinse off the CHG soap.  Pat dry with a clean towel  Put on clean pajamas    Additional instructions for the day of surgery: DO NOT APPLY any lotions, deodorants, cologne, or perfumes.   Do not wear jewelry or makeup Do not wear nail polish, gel polish, artificial nails, or any other type of covering on natural nails (fingers and toes) Do not bring valuables to the hospital. [facility] is not responsible for valuables/personal belongings. Put on clean/comfortable clothes.  Please brush your teeth.  Ask your nurse before applying any prescription medications to the skin.

## 2024-01-20 ENCOUNTER — Encounter (INDEPENDENT_AMBULATORY_CARE_PROVIDER_SITE_OTHER)

## 2024-02-22 ENCOUNTER — Other Ambulatory Visit: Payer: Self-pay

## 2024-02-22 ENCOUNTER — Encounter (HOSPITAL_BASED_OUTPATIENT_CLINIC_OR_DEPARTMENT_OTHER): Payer: Self-pay | Admitting: *Deleted

## 2024-02-29 ENCOUNTER — Encounter (HOSPITAL_BASED_OUTPATIENT_CLINIC_OR_DEPARTMENT_OTHER): Payer: Self-pay

## 2024-02-29 ENCOUNTER — Ambulatory Visit (HOSPITAL_BASED_OUTPATIENT_CLINIC_OR_DEPARTMENT_OTHER): Admitting: Anesthesiology

## 2024-02-29 ENCOUNTER — Other Ambulatory Visit: Payer: Self-pay

## 2024-02-29 ENCOUNTER — Encounter (HOSPITAL_BASED_OUTPATIENT_CLINIC_OR_DEPARTMENT_OTHER)
Admission: RE | Disposition: A | Discharge status: Home / Self Care | Payer: Self-pay | Source: Home / Self Care | Attending: Otolaryngology

## 2024-02-29 ENCOUNTER — Ambulatory Visit (HOSPITAL_BASED_OUTPATIENT_CLINIC_OR_DEPARTMENT_OTHER)
Admission: RE | Admit: 2024-02-29 | Discharge: 2024-03-01 | Disposition: A | Discharge status: Home / Self Care | Attending: Otolaryngology | Admitting: Otolaryngology

## 2024-02-29 DIAGNOSIS — Z01818 Encounter for other preprocedural examination: Secondary | ICD-10-CM

## 2024-02-29 DIAGNOSIS — E041 Nontoxic single thyroid nodule: Secondary | ICD-10-CM

## 2024-02-29 DIAGNOSIS — E063 Autoimmune thyroiditis: Secondary | ICD-10-CM | POA: Diagnosis not present

## 2024-02-29 HISTORY — DX: Nontoxic single thyroid nodule: E04.1

## 2024-02-29 HISTORY — DX: Personal history of other endocrine, nutritional and metabolic disease: Z86.39

## 2024-02-29 HISTORY — DX: Anemia, unspecified: D64.9

## 2024-02-29 HISTORY — PX: CONTINUOUS NERVE MONITORING: SHX6650

## 2024-02-29 HISTORY — PX: THYROID LOBECTOMY: SHX420

## 2024-02-29 LAB — POCT PREGNANCY, URINE: Preg Test, Ur: NEGATIVE

## 2024-02-29 SURGERY — LOBECTOMY, THYROID
Anesthesia: General | Site: Neck | Laterality: Right

## 2024-02-29 MED ORDER — IBUPROFEN 200 MG PO TABS
200.0000 mg | ORAL_TABLET | Freq: Four times a day (QID) | ORAL | Status: DC
  Administered 2024-02-29 – 2024-03-01 (×2): 200 mg via ORAL
  Filled 2024-02-29 (×2): qty 1

## 2024-02-29 MED ORDER — DEXMEDETOMIDINE HCL IN NACL 80 MCG/20ML IV SOLN
INTRAVENOUS | Status: DC | PRN
Start: 2024-02-29 — End: 2024-02-29
  Administered 2024-02-29: 8 ug via INTRAVENOUS
  Administered 2024-02-29 (×2): 4 ug via INTRAVENOUS

## 2024-02-29 MED ORDER — ONDANSETRON HCL 4 MG/2ML IJ SOLN: 4.0000 mg | Freq: Four times a day (QID) | INTRAMUSCULAR | Status: DC | PRN

## 2024-02-29 MED ORDER — FENTANYL CITRATE (PF) 100 MCG/2ML IJ SOLN
INTRAMUSCULAR | Status: AC
  Filled 2024-02-29: qty 2

## 2024-02-29 MED ORDER — PROPOFOL 10 MG/ML IV BOLUS
INTRAVENOUS | Status: AC
  Filled 2024-02-29: qty 20

## 2024-02-29 MED ORDER — SUCCINYLCHOLINE CHLORIDE 200 MG/10ML IV SOSY
PREFILLED_SYRINGE | INTRAVENOUS | Status: DC | PRN
  Administered 2024-02-29: 100 mg via INTRAVENOUS

## 2024-02-29 MED ORDER — LACTATED RINGERS IV SOLN: INTRAVENOUS | Status: DC

## 2024-02-29 MED ORDER — OXYCODONE HCL 5 MG/5ML PO SOLN: 5.0000 mg | Freq: Once | ORAL | Status: DC | PRN

## 2024-02-29 MED ORDER — CEFAZOLIN SODIUM-DEXTROSE 2-3 GM-%(50ML) IV SOLR
INTRAVENOUS | Status: DC | PRN
  Administered 2024-02-29: 2 g via INTRAVENOUS

## 2024-02-29 MED ORDER — ENOXAPARIN SODIUM 40 MG/0.4ML IJ SOSY: 40.0000 mg | PREFILLED_SYRINGE | INTRAMUSCULAR | Status: DC

## 2024-02-29 MED ORDER — PROPOFOL 500 MG/50ML IV EMUL
INTRAVENOUS | Status: DC | PRN
  Administered 2024-02-29: 50 ug/kg/min via INTRAVENOUS

## 2024-02-29 MED ORDER — PROPOFOL 10 MG/ML IV BOLUS
INTRAVENOUS | Status: DC | PRN
  Administered 2024-02-29: 30 mg via INTRAVENOUS
  Administered 2024-02-29: 150 mg via INTRAVENOUS

## 2024-02-29 MED ORDER — ACETAMINOPHEN 325 MG RE SUPP: 650.0000 mg | RECTAL | Status: DC

## 2024-02-29 MED ORDER — FENTANYL CITRATE (PF) 100 MCG/2ML IJ SOLN
INTRAMUSCULAR | Status: DC | PRN
  Administered 2024-02-29 (×4): 50 ug via INTRAVENOUS

## 2024-02-29 MED ORDER — DOCUSATE SODIUM 100 MG PO CAPS
100.0000 mg | ORAL_CAPSULE | Freq: Two times a day (BID) | ORAL | Status: DC
  Administered 2024-02-29: 100 mg via ORAL
  Filled 2024-02-29: qty 1

## 2024-02-29 MED ORDER — LIDOCAINE-EPINEPHRINE 1 %-1:100000 IJ SOLN
INTRAMUSCULAR | Status: DC | PRN
  Administered 2024-02-29: 8 mL

## 2024-02-29 MED ORDER — OXYCODONE HCL 5 MG/5ML PO SOLN
5.0000 mg | ORAL | Status: DC | PRN
  Administered 2024-02-29 – 2024-03-01 (×3): 5 mg via ORAL
  Filled 2024-02-29 (×3): qty 5

## 2024-02-29 MED ORDER — PHENOL 1.4 % MT LIQD
1.0000 | OROMUCOSAL | Status: DC | PRN
  Filled 2024-02-29: qty 177

## 2024-02-29 MED ORDER — ACETAMINOPHEN 160 MG/5ML PO SOLN
650.0000 mg | ORAL | Status: DC
  Administered 2024-02-29 – 2024-03-01 (×3): 650 mg via ORAL
  Filled 2024-02-29 (×3): qty 20.3

## 2024-02-29 MED ORDER — CEFAZOLIN SODIUM 1 G IJ SOLR
INTRAMUSCULAR | Status: AC
  Filled 2024-02-29: qty 10

## 2024-02-29 MED ORDER — ONDANSETRON HCL 4 MG/2ML IJ SOLN: 4.0000 mg | INTRAMUSCULAR | Status: DC | PRN

## 2024-02-29 MED ORDER — FENTANYL CITRATE (PF) 100 MCG/2ML IJ SOLN
25.0000 ug | INTRAMUSCULAR | Status: DC | PRN
  Administered 2024-02-29 (×2): 25 ug via INTRAVENOUS

## 2024-02-29 MED ORDER — LIDOCAINE HCL (CARDIAC) PF 100 MG/5ML IV SOSY
PREFILLED_SYRINGE | INTRAVENOUS | Status: DC | PRN
  Administered 2024-02-29: 50 mg via INTRAVENOUS

## 2024-02-29 MED ORDER — MIDAZOLAM HCL 5 MG/5ML IJ SOLN
INTRAMUSCULAR | Status: DC | PRN
  Administered 2024-02-29: 2 mg via INTRAVENOUS

## 2024-02-29 MED ORDER — ONDANSETRON HCL 4 MG/2ML IJ SOLN
INTRAMUSCULAR | Status: DC | PRN
  Administered 2024-02-29: 4 mg via INTRAVENOUS

## 2024-02-29 MED ORDER — OXYCODONE HCL 5 MG PO TABS: 5.0000 mg | ORAL_TABLET | Freq: Once | ORAL | Status: DC | PRN

## 2024-02-29 MED ORDER — PHENYLEPHRINE HCL-NACL 20-0.9 MG/250ML-% IV SOLN
INTRAVENOUS | Status: DC | PRN
Start: 2024-02-29 — End: 2024-02-29
  Administered 2024-02-29: 40 ug/min via INTRAVENOUS

## 2024-02-29 MED ORDER — ONDANSETRON HCL 4 MG PO TABS: 4.0000 mg | ORAL_TABLET | ORAL | Status: DC | PRN

## 2024-02-29 MED ORDER — PHENYLEPHRINE HCL (PRESSORS) 10 MG/ML IV SOLN
INTRAVENOUS | Status: DC | PRN
  Administered 2024-02-29 (×6): 80 ug via INTRAVENOUS

## 2024-02-29 MED ORDER — DEXAMETHASONE SODIUM PHOSPHATE 4 MG/ML IJ SOLN
INTRAMUSCULAR | Status: DC | PRN
  Administered 2024-02-29: 10 mg via INTRAVENOUS

## 2024-02-29 MED ORDER — ONDANSETRON HCL 4 MG/2ML IJ SOLN
INTRAMUSCULAR | Status: AC
  Filled 2024-02-29: qty 2

## 2024-02-29 MED ORDER — DEXAMETHASONE SODIUM PHOSPHATE 10 MG/ML IJ SOLN
INTRAMUSCULAR | Status: AC
  Filled 2024-02-29: qty 1

## 2024-02-29 MED ORDER — MIDAZOLAM HCL 2 MG/2ML IJ SOLN
INTRAMUSCULAR | Status: AC
  Filled 2024-02-29: qty 2

## 2024-02-29 SURGICAL SUPPLY — 67 items
BLADE CLIPPER SURG (BLADE) IMPLANT
BLADE SURG 10 STRL SS (BLADE) IMPLANT
BLADE SURG 15 STRL LF DISP TIS (BLADE) ×2 IMPLANT
CANISTER SUCT 1200ML W/VALVE (MISCELLANEOUS) ×2 IMPLANT
CLIP TI MEDIUM 6 (CLIP) ×2 IMPLANT
CLIP TI WIDE RED SMALL 6 (CLIP) ×2 IMPLANT
CORD BIPOLAR FORCEPS 12FT (ELECTRODE) ×2 IMPLANT
COVER BACK TABLE 60X90IN (DRAPES) ×2 IMPLANT
COVER MAYO STAND STRL (DRAPES) ×2 IMPLANT
DERMABOND ADVANCED .7 DNX12 (GAUZE/BANDAGES/DRESSINGS) ×2 IMPLANT
DRAIN CHANNEL 10F 3/8 F FF (DRAIN) IMPLANT
DRAPE INCISE IOBAN 66X45 STRL (DRAPES) ×2 IMPLANT
DRAPE U-SHAPE 76X120 STRL (DRAPES) ×2 IMPLANT
ELECT COATED BLADE 2.86 ST (ELECTRODE) ×2 IMPLANT
ELECT REM PT RETURN 9FT ADLT (ELECTROSURGICAL) ×2 IMPLANT
ELECTRODE REM PT RTRN 9FT ADLT (ELECTROSURGICAL) ×2 IMPLANT
EVACUATOR SILICONE 100CC (DRAIN) IMPLANT
FORCEPS BIPOLAR SPETZLER 8 1.0 (NEUROSURGERY SUPPLIES) ×2 IMPLANT
GAUZE 4X4 16PLY ~~LOC~~+RFID DBL (SPONGE) ×2 IMPLANT
GAUZE SPONGE 4X4 12PLY STRL LF (GAUZE/BANDAGES/DRESSINGS) IMPLANT
GLOVE BIO SURGEON STRL SZ 6.5 (GLOVE) ×2 IMPLANT
GLOVE BIOGEL PI IND STRL 7.5 (GLOVE) IMPLANT
GLOVE SURG SS PI 6.5 STRL IVOR (GLOVE) IMPLANT
GLOVE SURG SS PI 7.0 STRL IVOR (GLOVE) IMPLANT
GLOVE SURG SS PI 7.5 STRL IVOR (GLOVE) IMPLANT
GOWN STRL REUS W/ TWL LRG LVL3 (GOWN DISPOSABLE) ×4 IMPLANT
HEMOSTAT SNOW SURGICEL 2X4 (HEMOSTASIS) IMPLANT
HEMOSTAT SURGICEL 2X14 (HEMOSTASIS) IMPLANT
MARKER SKIN DUAL TIP RULER LAB (MISCELLANEOUS) IMPLANT
NDL HYPO 25X1 1.5 SAFETY (NEEDLE) ×2 IMPLANT
NEEDLE HYPO 25X1 1.5 SAFETY (NEEDLE) ×2 IMPLANT
NS IRRIG 1000ML POUR BTL (IV SOLUTION) ×2 IMPLANT
PACK BASIN DAY SURGERY FS (CUSTOM PROCEDURE TRAY) ×2 IMPLANT
PAD MAGNETIC INSTR ST 16X20 (MISCELLANEOUS) IMPLANT
PENCIL SMOKE EVACUATOR (MISCELLANEOUS) ×2 IMPLANT
PIN SAFETY STERILE (MISCELLANEOUS) IMPLANT
PROBE NERVBE PRASS .33 (MISCELLANEOUS) ×2 IMPLANT
SET WALTER ACTIVATION W/DRAPE (SET/KITS/TRAYS/PACK) IMPLANT
SLEEVE SCD COMPRESS KNEE MED (STOCKING) ×2 IMPLANT
SPIKE FLUID TRANSFER (MISCELLANEOUS) IMPLANT
SPONGE INTESTINAL PEANUT (DISPOSABLE) ×2 IMPLANT
STAPLER SKIN PROX WIDE 3.9 (STAPLE) IMPLANT
SUT ETHILON 3 0 PS 1 (SUTURE) IMPLANT
SUT MNCRL AB 4-0 PS2 18 (SUTURE) IMPLANT
SUT MON AB 5-0 PS2 18 (SUTURE) ×2 IMPLANT
SUT PROLENE 2 0 SH DA (SUTURE) IMPLANT
SUT PROLENE 3 0 PS 2 (SUTURE) ×2 IMPLANT
SUT PROLENE 5 0 P 3 (SUTURE) IMPLANT
SUT SILK 2 0 SH (SUTURE) ×2 IMPLANT
SUT SILK 2 0 TIES 17X18 (SUTURE) IMPLANT
SUT SILK 3 0 PS 1 (SUTURE) IMPLANT
SUT SILK 3 0 RB1 (SUTURE) IMPLANT
SUT SILK 3-0 18XBRD TIE BLK (SUTURE) ×4 IMPLANT
SUT VIC AB 3-0 SH 27X BRD (SUTURE) IMPLANT
SUT VIC AB 4-0 PS2 18 (SUTURE) IMPLANT
SUT VIC AB 4-0 RB1 27X BRD (SUTURE) IMPLANT
SUT VIC AB 4-0 SH 18 (SUTURE) IMPLANT
SUT VICRYL 3-0 CR8 SH (SUTURE) ×2 IMPLANT
SYR BULB EAR ULCER 3OZ GRN STR (SYRINGE) ×2 IMPLANT
SYR CONTROL 10ML LL (SYRINGE) ×2 IMPLANT
TOWEL GREEN STERILE FF (TOWEL DISPOSABLE) ×4 IMPLANT
TRAY DSU PREP LF (CUSTOM PROCEDURE TRAY) ×2 IMPLANT
TUBE CONNECTING 20X1/4 (TUBING) ×2 IMPLANT
TUBE ENDOTRAC NIMS EMG 6MM (MISCELLANEOUS) IMPLANT
TUBE ENDOTRAC NIMS EMG 7MM (MISCELLANEOUS) IMPLANT
TUBE ENDOTRAC NIMS EMG 8MM (MISCELLANEOUS) IMPLANT
TUBE ENDOTRAC NIMS EMG 9MM (MISCELLANEOUS) IMPLANT

## 2024-02-29 NOTE — Anesthesia Postprocedure Evaluation (Signed)
 Anesthesia Post Note  Patient: Lanetta A Douglass  Procedure(s) Performed: THYROID LOBECTOMY, RIGHT (Right: Neck) NERVE MONITORING, INTRAOPERATIVE (Neck)     Patient location during evaluation: PACU Anesthesia Type: General Level of consciousness: awake Pain management: pain level controlled Vital Signs Assessment: post-procedure vital signs reviewed and stable Respiratory status: spontaneous breathing, nonlabored ventilation and respiratory function stable Cardiovascular status: blood pressure returned to baseline and stable Postop Assessment: no apparent nausea or vomiting Anesthetic complications: no   No notable events documented.  Last Vitals:  Vitals:   02/29/24 1645 02/29/24 1700  BP: 113/72 122/82  Pulse: 78 82  Resp: 17 15  Temp:    SpO2: 99% 98%    Last Pain:  Vitals:   02/29/24 1700  TempSrc:   PainSc: 5                  Samul Mcinroy P Li Bobo

## 2024-02-29 NOTE — H&P (Signed)
 Pre-Operative H&P - Day Of Surgery Patient Name: Dorothy Logan Date:   02/29/2024  HPI: Dorothy Logan is a 43 y.o. female who presents today for operative treatment of right thyroid nodule. Patient denies recent significant changes to health or significant new medications or physiologic change in condition which would immediately impact plans. No new types of therapy has been initiated that would change the plan or the appropriateness of the plan.   ROS:  A complete review of systems was obtained and is otherwise negative.   PMH:  Past Medical History:  Diagnosis Date   Anemia    Hx of goiter     PSH:  Past Surgical History:  Procedure Laterality Date   DILATION AND CURETTAGE OF UTERUS      MEDS:   Current Facility-Administered Medications:    lactated ringers infusion, , Intravenous, Continuous, Stoltzfus, Gregory P, DO  ALLERGIES: Demerol [meperidine hcl] and Latex  EXAM: Vitals: BP 125/79   Pulse 80   Temp 97.8 F (36.6 C) (Oral)   Resp 20   Ht 4\' 10"  (1.473 m)   Wt 65 kg   LMP  (Exact Date) Comment: UPT neg DOS  SpO2 100%   BMI 29.95 kg/m   General Awake, at baseline alertness.   HEENT No scleral icterus or conjunctival hemorrhage. Globe position appears normal. External ears  normal. Nose patent without rhinorrhea. No lymphadenopathy. No thyromegaly  Cardiovascular No cyanosis.  Pulmonary No audible stridor. Breathing easily with no labor.  Neuro Symmetric facial movement.   Psychiatry Appropriate affect and mood.  Skin No scars or lesions on face or neck.  Extermities Moves all extremities with normal range of motion.   Other Findings None.   Assessment & Plan: Brandis has diagnoses of right thyroid nodule and will go to the OR today for right thyroid lobectomy. Informed consent was obtained and available in EMR today. All questions have been answered, and risks/benefits/alternatives of procedure as noted in the consent were discussed in a quiet area. Questions  were invited and answered. The patient expressed understanding, provided consent and wished to proceed despite risks. Discussed risks of injury to RLN and post operative hematoma, among others. She is ready to proceed  Read Drivers 02/29/2024 12:21 PM

## 2024-02-29 NOTE — Anesthesia Preprocedure Evaluation (Signed)
 Anesthesia Evaluation  Patient identified by MRN, date of birth, ID band Patient awake    Reviewed: Allergy & Precautions, H&P , NPO status , Patient's Chart, lab work & pertinent test results  Airway Mallampati: II   Neck ROM: full    Dental   Pulmonary neg pulmonary ROS   breath sounds clear to auscultation       Cardiovascular negative cardio ROS  Rhythm:regular Rate:Normal     Neuro/Psych    GI/Hepatic   Endo/Other  goiter  Renal/GU      Musculoskeletal   Abdominal   Peds  Hematology   Anesthesia Other Findings   Reproductive/Obstetrics                             Anesthesia Physical Anesthesia Plan  ASA: 1  Anesthesia Plan: General   Post-op Pain Management:    Induction: Intravenous  PONV Risk Score and Plan: 3 and Ondansetron, Dexamethasone, Midazolam and Treatment may vary due to age or medical condition  Airway Management Planned: Oral ETT  Additional Equipment:   Intra-op Plan:   Post-operative Plan: Extubation in OR  Informed Consent: I have reviewed the patients History and Physical, chart, labs and discussed the procedure including the risks, benefits and alternatives for the proposed anesthesia with the patient or authorized representative who has indicated his/her understanding and acceptance.     Dental advisory given  Plan Discussed with: CRNA, Anesthesiologist and Surgeon  Anesthesia Plan Comments:        Anesthesia Quick Evaluation

## 2024-02-29 NOTE — Anesthesia Procedure Notes (Signed)
 Procedure Name: Intubation Date/Time: 02/29/2024 12:45 PM  Performed by: Burna Cash, CRNAPre-anesthesia Checklist: Patient identified, Emergency Drugs available, Suction available and Patient being monitored Patient Re-evaluated:Patient Re-evaluated prior to induction Oxygen Delivery Method: Circle system utilized Preoxygenation: Pre-oxygenation with 100% oxygen Induction Type: IV induction Ventilation: Mask ventilation without difficulty Laryngoscope Size: Glidescope and 3 Tube type: Oral Tube size: 7.0 mm Number of attempts: 1 Airway Equipment and Method: Stylet and Oral airway Placement Confirmation: ETT inserted through vocal cords under direct vision, positive ETCO2 and breath sounds checked- equal and bilateral Secured at: 20 cm Tube secured with: Tape Dental Injury: Teeth and Oropharynx as per pre-operative assessment  Comments: Glidescope utilized for visualization/confirmation of SIM tube placement. Verified by Anesthesia staff and surgeon.

## 2024-02-29 NOTE — Transfer of Care (Signed)
 Immediate Anesthesia Transfer of Care Note  Patient: Dorothy Logan  Procedure(s) Performed: THYROID LOBECTOMY, RIGHT (Right: Neck) NERVE MONITORING, INTRAOPERATIVE (Neck)  Patient Location: PACU  Anesthesia Type:General  Level of Consciousness: sedated  Airway & Oxygen Therapy: Patient Spontanous Breathing and Patient connected to face mask oxygen  Post-op Assessment: Report given to RN and Post -op Vital signs reviewed and stable  Post vital signs: Reviewed and stable  Last Vitals:  Vitals Value Taken Time  BP 108/65 02/29/24 1600  Temp    Pulse 82 02/29/24 1610  Resp 21 02/29/24 1610  SpO2 100 % 02/29/24 1610  Vitals shown include unfiled device data.  Last Pain:  Vitals:   02/29/24 1127  TempSrc: Oral  PainSc: 0-No pain      Patients Stated Pain Goal: 7 (02/29/24 1127)  Complications: No notable events documented.

## 2024-02-29 NOTE — Op Note (Addendum)
 Otolaryngology Operative note  Dorothy Logan Date/Time of Admission: 02/29/2024 10:57 AM  CSN: [id number];MRN:[id number]  DOB: 02-04-1981 Age: 43 y.o. Location: [facility]    Pre-Op Diagnosis: Right thyroid Nodule  Post-Op Diagnosis: Same  Procedure: Procedure(s): Right thyroid Lobectomy with Isthmusectomy - CPT  62130  Surgeon: Jovita Kussmaul, MD  Anesthesia type:  General  Anesthesiologist: Anesthesiologist: Leonides Grills, MD; Achille Rich, MD CRNA: Burna Cash, CRNA   Staff: Circulator: Raliegh Scarlet, RN Physician Assistant: Eyvonne Mechanic, PA-C Relief Scrub: Verdie Drown Scrub Person: Paulita Fujita A  Specimens: ID Type Source Tests Collected by Time Destination  1 : Right thyroid lobectomy Tissue PATH ENT excision SURGICAL PATHOLOGY Read Drivers, MD 02/29/2024 1338     EBL:  150 mL  Drains: None  Post-op disposition and condition: PACU, hemodynamically stable   Findings: Large right thyroid nodule; successful right thyroid lobectomy; Recurrent Laryngeal Nerve was identified, preserved, and noted to be stimulating at end of procedure at 0.7 mA  Complications: None apparent  Indications and consent:  Dorothy Logan is a 43 y.o. female with a right thyroid nodule which is growing and > 4 cm. The patient's options were discussed, including risks/benefits/alternatives for each option. Patient expressed understanding, and despite these risks, consented and decided to proceed with above procedures. Informed consent was signed before proceeding.  Procedure: After being properly identified in the preoperative holding area, the patient was brought into the operating suite.  Patient was placed supine on the operating table.  A pre-procedural time-out was performed. Pre-operative antibiotics and steroids were administered. After induction of general anesthesia, the patient was successfully intubated with a NIM endotracheal tube  confirmed with CO2 return. Proper tube placement was confirmed. The patient was prepped and draped in the usual fashion for this procedure.   Using a marking pen, a line was drawn horizontally in a natural neck crease. The skin and subcutaneous tissues were sharply dissected to the level of the platysma. The platysma itself was then incised and sub-platysmal flaps were elevated. The midline raphe between the strap muscles was identified. This was divided from the thyroid cartilage to near the sternal notch. The strap muscles were retracted laterally exposing the right thyroid lobe. The thyroid lobe was retracted medially and the strap muscles overlying this lobe were carefully dissected free. The superior pole was identified and with the thyroid retracted inferiorly, the vessels were progressively ligated with 2-0 silk suture and hemo-clips. Care was taken to stay very near the gland to prevent injury to the superior laryngeal nerve. With the superior pole freed, attachments along the mid thyroid were released and the lobe was retracted medially. Working lateral to the cricothyroid joint, the recurrent laryngeal nerve was identified. The nerve was stimulated and was noted to stimulate appropriately. The nerve was then dissected along its course toward the cricothyroid membrane. Soft tissue attachments were lifted off the nerve further freeing the gland and allowing it to roll medially. The inferior pole was then controlled by identifying, and ligating inferior pole vessels using 2-0 silk ties and hemo-clips. Superior and inferior parathyroid gland candidates were identified and preserved.   Finally, the lobe was lifted off the trachea and berry's ligament was ligated and the isthmus was lifted off the trachea. The specimen was passed off the field as a permanent specimen. The residual isthmus and lobe were marked with a running 2-0 prolene suture.   Bipolar cautery was used for hemostasis. Hemostasis was then  obtained,  confirmed with valsalva, and the wound bed was copiously irrigated with saline. Additional hemostatic agent (Surgicel SNoW) was used. 3-0 vicryl interrupted sutures were use to close the strap muscles. 3-0 vicryl interrupted sutures were used to close the platysma and inverted 4-0 vicryl sutures for the deep dermis in an interrupted fashion. A 5.0 Monocryl suture was used to approximate the skin in a running subcuticular fashion. Dermabond was applied to the skin.  With the surgical portion of the procedure complete, all instrumentation was then removed from the operative field. The patient's skin was cleaned.  Patient was returned to the care of the anesthesia team.  Patient was then weaned from the anesthetic and transported to the PACU in stable condition.  The advanced practice practitioner (APP) - Burna Forts - assisted throughout the case. He was essential in retraction and counter traction when needed to make the case progress smoothly. This retraction and assistance made it possible to see the tissue planes for the procedure and safe dissection of critical structures. The assistance was also needed for hemostasis, tissue re-approximation and assisted with closure of the incision site   Read Drivers  Dispo: Will admit to monitor overnight. Ok for regular diet; plan to d/c 3/27 if doing well

## 2024-03-01 ENCOUNTER — Encounter (HOSPITAL_BASED_OUTPATIENT_CLINIC_OR_DEPARTMENT_OTHER): Payer: Self-pay | Admitting: Otolaryngology

## 2024-03-01 DIAGNOSIS — E041 Nontoxic single thyroid nodule: Secondary | ICD-10-CM

## 2024-03-01 MED ORDER — ONDANSETRON HCL 4 MG PO TABS: 4.0000 mg | ORAL_TABLET | Freq: Three times a day (TID) | ORAL | 0 refills | Status: DC | PRN

## 2024-03-01 MED ORDER — OXYCODONE HCL 5 MG PO TABS: 5.0000 mg | ORAL_TABLET | Freq: Four times a day (QID) | ORAL | 0 refills | Status: AC | PRN

## 2024-03-01 MED ORDER — ACETAMINOPHEN 500 MG PO TABS: 1000.0000 mg | ORAL_TABLET | Freq: Four times a day (QID) | ORAL | 1 refills | Status: AC | PRN

## 2024-03-01 MED ORDER — IBUPROFEN 800 MG PO TABS: 400.0000 mg | ORAL_TABLET | Freq: Four times a day (QID) | ORAL | 0 refills | Status: AC | PRN

## 2024-03-01 NOTE — Discharge Instructions (Addendum)
  Surgery Discharge Instructions:  Call clinic or return to ED if you: - develop a fever greater than 101.4 - have shaking chills or are feeling ill - become short of breath - have uncontrollable nausea or vomiting - can't hold down food or liquids or feel as though you are getting dehydrated - have leakage or drainage from wound - urine output of less than 30cc/hr for 12 hours - develop significant redness, pain at incision(s) or wound opens up/separates - any other acute events, problems, or concerns  Wound Care/Dressings/Drain Instructions:  - To take care of your incision/cut:  - It is ok to shower in 48 hours, but avoid rubbing the incision or submerging under water like in a bath or swimming pool - Your stitches are absorbable and will dissolve on their own  - Your incision is closed with skin glue. It will peel off on its own in 1-2 weeks. You can let water run over it in 48 hours like in a shower and gently pat dry. Avoid rubbing the incision or submerging under like like a bath or swimming pool.  Do not put any ointment on the incision - Mild swelling around the incision and neck is expected and should go away in 1-2 weeks   Medications: - Resume your regular home medications except as detailed in the medication reconciliation.  - For pain, take tylenol 1000mg  every 6 hours and Ibuprofen 400mg  every 6 hours. If that is not sufficient, a stronger pain medication has been prescribed to you (oxycodone - 5mg  tablet every 6 hours as needed). Do not mix with any other narcotic medication.  Follow Up:  - A follow up appointment should be scheduled for you after discharge. Please let us know if you do not know this information.  Activity/Restrictions:  - Resume your regular activities, as tolerated. Avoid heavy lifting or straining (more than 5 lbs) for 10 days.  Diet: - Resume your regular diet, as tolerated  Additional Instructions: - Please take an over the counter stool  softener while taking narcotic pain medication - DO NOT MIX NARCOTIC PAIN MEDICATIONS OR TAKE NARCOTIC PRESCRIPTIONS AT THE SAME TIME - DO NOT DRIVE OR OPERATE HEAVY MACHINERY WHILE ON NARCOTICS  - DO NOT TAKE MORE THAN 4 GRAMS (4000mg ) OF TYLENOL (ACETAMINOPHEN) IN 24 HOURS   ENT (Dr. Eliane Decree office) Contact Info: ENT Front Desk Phone including questions about appointments or questions: [phone] - If after normal business hours (Monday-Friday after 5PM or Weekends/Holidays), please call same number and follow prompts for Patient Access Line. There is a physician on call for urgent matters. For life threatening emergencies, please call 911    Next dose of tylenol if needed is at 10:30am Next dose of ibuprofen if needed is at 10:30am

## 2024-03-01 NOTE — Discharge Summary (Signed)
 Physician Discharge Summary  Patient ID: Dorothy Logan MRN: [id number] DOB/AGE: 12/09/1980 43 y.o.  Admit date: 02/29/2024 Discharge date: 03/01/2024  Admission Diagnoses:  Principal Problem:   Right thyroid nodule  Discharge Diagnoses:  Same  Surgeries: Procedure(s): THYROID LOBECTOMY, RIGHT; NERVE MONITORING, INTRAOPERATIVE on 02/29/2024   Consultants: None  Discharged Condition: Stable  Hospital Course: Dorothy Logan is an 43 y.o. female who was admitted 02/29/2024 for post-op monitoring after right thyroid lobectomy.  They were brought to the operating room on 02/29/2024 and underwent the above named procedures.  She was monitored on the floor and then overnight for observation. She was voiding, eating and drinking and had met her milestones on POD 1, and after discussion, decision was made for discharge. Nursing staff will go over incision care and other instructions were also discussed  Physical Exam:  General: Awake and alert, no acute distress Neck: soft, flat; incision c/d/I, dermabond in place Respiratory: Respiratory effort is normal. Lungs clear to auscultation. Voice is strong, no breathiness Extremities warm, well perfused  Recent vital signs:  Vitals:   03/01/24 0630 03/01/24 0730  BP:    Pulse: 89 82  Resp:    Temp:    SpO2: 99% 99%    Recent laboratory studies:  Results for orders placed or performed during the hospital encounter of 02/29/24  Pregnancy, urine POC   Collection Time: 02/29/24 11:26 AM  Result Value Ref Range   Preg Test, Ur NEGATIVE NEGATIVE    Discharge Medications:   Allergies as of 03/01/2024       Reactions   Demerol [meperidine Hcl] Itching   Latex Hives        Medication List     TAKE these medications    acetaminophen 500 MG tablet Commonly known as: TYLENOL Take 2 tablets (1,000 mg total) by mouth every 6 (six) hours as needed for up to 10 days (pain.).   ascorbic acid 500 MG tablet Commonly known as: VITAMIN  C Take 500 mg by mouth daily.   CANNABIDIOL PO Take 150 mg by mouth at bedtime as needed (sleep). THC GUMMY   COLLAGEN-VITAMIN C-BIOTIN PO Take 1 Dose by mouth in the morning. 2 teaspoons   diphenhydrAMINE 25 mg capsule Commonly known as: BENADRYL Take 25-50 mg by mouth every 6 (six) hours as needed (itching/allergic reaction.).   ibuprofen 800 MG tablet Commonly known as: ADVIL Take 0.5 tablets (400 mg total) by mouth every 6 (six) hours as needed (pain.). What changed:  how much to take when to take this   Magnesium 250 MG Tabs Take 250 mg by mouth 3 (three) times a week.   ondansetron 4 MG tablet Commonly known as: Zofran Take 1 tablet (4 mg total) by mouth every 8 (eight) hours as needed for nausea or vomiting.   oxyCODONE 5 MG immediate release tablet Commonly known as: Roxicodone Take 1 tablet (5 mg total) by mouth every 6 (six) hours as needed for up to 7 days for severe pain (pain score 7-10).        Diagnostic Studies: No results found.  Disposition: Discharge disposition: 01-Home or Self Care       Discharge Instructions     Diet general   Complete by: As directed    Increase activity slowly   Complete by: As directed           Signed: Read Drivers 03/01/2024, 7:51 AM

## 2024-03-02 ENCOUNTER — Telehealth (INDEPENDENT_AMBULATORY_CARE_PROVIDER_SITE_OTHER): Payer: Self-pay

## 2024-03-02 ENCOUNTER — Encounter (INDEPENDENT_AMBULATORY_CARE_PROVIDER_SITE_OTHER): Payer: Self-pay | Admitting: Otolaryngology

## 2024-03-02 LAB — SURGICAL PATHOLOGY

## 2024-03-02 NOTE — Telephone Encounter (Signed)
 Informed patient of normal thyroid results per Dr .Allena Katz. Patient verbalized understanding. Also informed patient letter is ready for pick up.

## 2024-03-05 ENCOUNTER — Ambulatory Visit (INDEPENDENT_AMBULATORY_CARE_PROVIDER_SITE_OTHER): Admitting: Otolaryngology

## 2024-03-05 ENCOUNTER — Encounter (INDEPENDENT_AMBULATORY_CARE_PROVIDER_SITE_OTHER): Payer: Self-pay

## 2024-03-05 VITALS — BP 120/78 | HR 74 | Ht <= 58 in | Wt 140.0 lb

## 2024-03-05 DIAGNOSIS — Z9889 Other specified postprocedural states: Secondary | ICD-10-CM

## 2024-03-05 DIAGNOSIS — R490 Dysphonia: Secondary | ICD-10-CM

## 2024-03-05 DIAGNOSIS — E041 Nontoxic single thyroid nodule: Secondary | ICD-10-CM

## 2024-03-05 NOTE — Progress Notes (Signed)
 S/p right thyroid lobectomy on 02/29/2024 S: Doing well, No pain, no fevers, incisional issues; voice is doing well, slight dysphonia but improving; no trouble swallowing. Path is back and we did discuss the results O: neck soft, flat, incision closed with dermabond and c/d/I. TFL was indicated to better evaluate the proximal airway, given the patient's history and exam findings, and is detailed below.  Procedure Note Pre-procedure diagnosis:  Dysphonia post thyroidectomy Post-procedure diagnosis: Same Procedure: Transnasal Fiberoptic Laryngoscopy, CPT 31575 - Mod 25 Indication: see above Complications: None apparent EBL: 0 mL  The procedure was undertaken to further evaluate complaint above, with mirror exam inadequate for appropriate examination due to gag reflex and poor patient tolerance  Procedure:  Patient was identified as correct patient. Verbal consent was obtained. The nose was sprayed with oxymetazoline and 4% lidocaine. The The flexible laryngoscope was passed through the nose to view the nasal cavity, pharynx (oropharynx, hypopharynx) and larynx.  The larynx was examined at rest and during multiple phonatory tasks. Documentation was obtained and reviewed with patient. The scope was removed. The patient tolerated the procedure well.  Findings: The nasal cavity and nasopharynx did not reveal any masses or lesions, mucosa appeared to be without obvious lesions. The tongue base, pharyngeal walls, piriform sinuses, vallecula, epiglottis and postcricoid region are normal in appearance. The visualized portion of the subglottis and proximal trachea is widely patent. The vocal folds are mobile bilaterally. There are no lesions on the free edge of the vocal folds nor elsewhere in the larynx worrisome for malignancy.       Path:   A/P: 43 y.o. with right thyroid nodule s/p right thyroid lobectomy 02/29/2024. Recovering appropriately. TFL shows mobile vocal folds - Recovering appropriately;  no malignancy noted  -Will check TSH/FT4 in 1 month; call if issue - She has a small left thyroid nodule prior but not noted on recent US, which is subcentimeter; advised f/u with PCP regarding this for observation F/u with me PRN  [employer]

## 2024-08-10 ENCOUNTER — Emergency Department (HOSPITAL_COMMUNITY): Admission: EM | Admit: 2024-08-10 | Discharge: 2024-08-10 | Disposition: A | Discharge status: Home / Self Care

## 2024-08-10 DIAGNOSIS — R002 Palpitations: Secondary | ICD-10-CM | POA: Insufficient documentation

## 2024-08-10 DIAGNOSIS — G43909 Migraine, unspecified, not intractable, without status migrainosus: Secondary | ICD-10-CM | POA: Insufficient documentation

## 2024-08-10 DIAGNOSIS — Z91040 Latex allergy status: Secondary | ICD-10-CM | POA: Insufficient documentation

## 2024-08-10 DIAGNOSIS — R519 Headache, unspecified: Secondary | ICD-10-CM | POA: Diagnosis present

## 2024-08-10 LAB — CBC WITH DIFFERENTIAL/PLATELET
Abs Immature Granulocytes: 0.01 K/uL (ref 0.00–0.07)
Basophils Absolute: 0 K/uL (ref 0.0–0.1)
Basophils Relative: 0 %
Eosinophils Absolute: 0 K/uL (ref 0.0–0.5)
Eosinophils Relative: 0 %
HCT: 42 % (ref 36.0–46.0)
Hemoglobin: 13.4 g/dL (ref 12.0–15.0)
Immature Granulocytes: 0 %
Lymphocytes Relative: 25 %
Lymphs Abs: 1.7 K/uL (ref 0.7–4.0)
MCH: 26 pg (ref 26.0–34.0)
MCHC: 31.9 g/dL (ref 30.0–36.0)
MCV: 81.6 fL (ref 80.0–100.0)
Monocytes Absolute: 0.5 K/uL (ref 0.1–1.0)
Monocytes Relative: 7 %
Neutro Abs: 4.5 K/uL (ref 1.7–7.7)
Neutrophils Relative %: 68 %
Platelets: 269 K/uL (ref 150–400)
RBC: 5.15 MIL/uL — ABNORMAL HIGH (ref 3.87–5.11)
RDW: 14.9 % (ref 11.5–15.5)
WBC: 6.8 K/uL (ref 4.0–10.5)
nRBC: 0 % (ref 0.0–0.2)

## 2024-08-10 LAB — BASIC METABOLIC PANEL WITH GFR
Anion gap: 14 (ref 5–15)
BUN: 14 mg/dL (ref 6–20)
CO2: 23 mmol/L (ref 22–32)
Calcium: 9.4 mg/dL (ref 8.9–10.3)
Chloride: 100 mmol/L (ref 98–111)
Creatinine, Ser: 0.82 mg/dL (ref 0.44–1.00)
GFR, Estimated: >60 mL/min (ref >60)
Glucose, Bld: 85 mg/dL (ref 70–99)
Potassium: 4.3 mmol/L (ref 3.5–5.1)
Sodium: 137 mmol/L (ref 135–145)

## 2024-08-10 LAB — TSH: TSH: 2.18 u[IU]/mL (ref 0.350–4.500)

## 2024-08-10 MED ORDER — PROCHLORPERAZINE EDISYLATE 10 MG/2ML IJ SOLN
10.0000 mg | Freq: Once | INTRAMUSCULAR | Status: AC
  Administered 2024-08-10: 10 mg via INTRAVENOUS
  Filled 2024-08-10: qty 2

## 2024-08-10 MED ORDER — KETOROLAC TROMETHAMINE 15 MG/ML IJ SOLN
15.0000 mg | Freq: Once | INTRAMUSCULAR | Status: AC
  Administered 2024-08-10: 15 mg via INTRAVENOUS
  Filled 2024-08-10: qty 1

## 2024-08-10 MED ORDER — DIPHENHYDRAMINE HCL 50 MG/ML IJ SOLN
12.5000 mg | Freq: Once | INTRAMUSCULAR | Status: AC
  Administered 2024-08-10: 12.5 mg via INTRAVENOUS
  Filled 2024-08-10: qty 1

## 2024-08-10 MED ORDER — LACTATED RINGERS IV BOLUS
1000.0000 mL | Freq: Once | INTRAVENOUS | Status: AC
  Administered 2024-08-10: 1000 mL via INTRAVENOUS

## 2024-08-10 NOTE — Discharge Instructions (Addendum)
 Blood work was reassuring.  Symptoms improved after fluids and migraine cocktail.  Follow-up with your primary care doctor.  Return for any emergent symptoms.

## 2024-08-10 NOTE — ED Provider Notes (Signed)
 [place] EMERGENCY DEPARTMENT AT [facility] Provider Note   CSN: [id number] Arrival date & time: 08/10/24  1617     Patient presents with: Headache   Dorothy Logan is a 43 y.o. female.   43 year old female presents today for concern of an episode she had about 1 hour ago.  She states she had palpitations, diaphoresis.  This episode lasted about an hour but then resolved and now she is left with headache.  She states she is diagnosed with chronic debilitating migraines.  Current headache is left-sided.  Feels similar to her previous migraine headaches.  Does have some photophobia.  Denies any vision change or other complaints.  She states she drinks about half a gallon of water a day but also drinks significant mounts of caffeine.  She is a Technical sales engineer.  The history is provided by the patient. No language interpreter was used.       Prior to Admission medications   Medication Sig Start Date End Date Taking? Authorizing Provider  ascorbic acid (VITAMIN C) 500 MG tablet Take 500 mg by mouth daily. 11/22/22   [provider]  CANNABIDIOL PO Take 150 mg by mouth at bedtime as needed (sleep). THC GUMMY    [provider]  COLLAGEN-VITAMIN C-BIOTIN PO Take 1 Dose by mouth in the morning. 2 teaspoons    [provider]  diphenhydrAMINE  (BENADRYL ) 25 mg capsule Take 25-50 mg by mouth every 6 (six) hours as needed (itching/allergic reaction.).    [provider]  ibuprofen  (ADVIL ) 800 MG tablet Take 0.5 tablets (400 mg total) by mouth every 6 (six) hours as needed (pain.). 03/01/24   Tobie Eldora NOVAK, MD  Magnesium 250 MG TABS Take 250 mg by mouth 3 (three) times a week.    [provider]  ondansetron  (ZOFRAN ) 4 MG tablet Take 1 tablet (4 mg total) by mouth every 8 (eight) hours as needed for nausea or vomiting. 03/01/24   Tobie Eldora NOVAK, MD    Allergies: Demerol [meperidine hcl] and Latex    Review of Systems  Constitutional:   Negative for chills and fever.  Eyes:  Positive for photophobia. Negative for visual disturbance.  Respiratory:  Negative for shortness of breath.   Cardiovascular:  Negative for chest pain.  Neurological:  Positive for headaches. Negative for light-headedness.  All other systems reviewed and are negative.   Updated Vital Signs BP 128/80 (BP Location: Left Arm)   Pulse 78   Temp 98.2 F (36.8 C) (Oral)   Resp 18   SpO2 100%   Physical Exam Vitals and nursing note reviewed.  Constitutional:      General: She is not in acute distress.    Appearance: Normal appearance. She is not ill-appearing.  HENT:     Head: Normocephalic and atraumatic.     Nose: Nose normal.  Eyes:     Conjunctiva/sclera: Conjunctivae normal.  Cardiovascular:     Rate and Rhythm: Normal rate and regular rhythm.  Pulmonary:     Effort: Pulmonary effort is normal. No respiratory distress.  Musculoskeletal:        General: No deformity. Normal range of motion.     Cervical back: Normal range of motion.  Skin:    Findings: No rash.  Neurological:     General: No focal deficit present.     Mental Status: She is alert and oriented to person, place, and time. Mental status is at baseline.     Comments: Cranial nerves  III through XII intact.  Full range of motion in bilateral upper and lower extremities with 5/5 strength in extensor and flexor muscle groups.  Pupils equal round reactive to light.     (all labs ordered are listed, but only abnormal results are displayed) Labs Reviewed  CBC WITH DIFFERENTIAL/PLATELET  BASIC METABOLIC PANEL WITH GFR  TSH    EKG: None  Radiology: No results found.   Procedures   Medications Ordered in the ED  lactated ringers  bolus 1,000 mL (has no administration in time range)                                    Medical Decision Making Amount and/or Complexity of Data Reviewed Labs: ordered.  Risk Prescription drug management.   43 year old female  presents today for an episode she had about an hour ago.  Does have significant nasal caffeine intake.  Also reports adequate amount of hydration.  The palpitations have completely resolved. Will provide fluids, obtain lab work and provide migraine cocktail and reevaluate.  Lab work reassuring.  CBC without leukocytosis or anemia.  BMP unremarkable, TSH within normal.  EKG without acute ischemic change.  Patient feels improved after migraine cocktail.  Appropriate for discharge. Discharged in stable condition.  Final diagnoses:  Migraine without status migrainosus, not intractable, unspecified migraine type  Palpitations    ED Discharge Orders     None          Hildegard Loge, PA-C 08/10/24 1942    Dasie Faden, MD 08/10/24 [phone]

## 2024-08-10 NOTE — ED Triage Notes (Signed)
 Patient in today via EMS reporting feeling bad. Heart racing, sweaty, and dizziness while driving. Reports that she now feels better but reporting headache now at this time.

## 2024-08-24 NOTE — Progress Notes (Signed)
 Patient presents with request for IUD removal.  Patient advised that we do not perform such procedures in an Urgent Care center.  Patient was advised to follow up with GYN.  Patient has appointment in October with GYN.

## 2024-10-01 ENCOUNTER — Emergency Department (HOSPITAL_BASED_OUTPATIENT_CLINIC_OR_DEPARTMENT_OTHER)

## 2024-10-01 ENCOUNTER — Emergency Department (HOSPITAL_BASED_OUTPATIENT_CLINIC_OR_DEPARTMENT_OTHER)
Admission: EM | Admit: 2024-10-01 | Discharge: 2024-10-01 | Disposition: A | Discharge status: Home / Self Care | Attending: Emergency Medicine | Admitting: Emergency Medicine

## 2024-10-01 ENCOUNTER — Other Ambulatory Visit: Payer: Self-pay

## 2024-10-01 ENCOUNTER — Telehealth (HOSPITAL_BASED_OUTPATIENT_CLINIC_OR_DEPARTMENT_OTHER): Payer: Self-pay | Admitting: Emergency Medicine

## 2024-10-01 ENCOUNTER — Encounter (HOSPITAL_BASED_OUTPATIENT_CLINIC_OR_DEPARTMENT_OTHER): Payer: Self-pay | Admitting: Emergency Medicine

## 2024-10-01 DIAGNOSIS — D649 Anemia, unspecified: Secondary | ICD-10-CM | POA: Insufficient documentation

## 2024-10-01 DIAGNOSIS — Z91040 Latex allergy status: Secondary | ICD-10-CM | POA: Diagnosis not present

## 2024-10-01 DIAGNOSIS — R42 Dizziness and giddiness: Secondary | ICD-10-CM | POA: Diagnosis present

## 2024-10-01 DIAGNOSIS — Z8639 Personal history of other endocrine, nutritional and metabolic disease: Secondary | ICD-10-CM | POA: Insufficient documentation

## 2024-10-01 HISTORY — DX: Dizziness and giddiness: R42

## 2024-10-01 LAB — CBC
HCT: 40.8 % (ref 36.0–46.0)
Hemoglobin: 13.2 g/dL (ref 12.0–15.0)
MCH: 25.8 pg — ABNORMAL LOW (ref 26.0–34.0)
MCHC: 32.4 g/dL (ref 30.0–36.0)
MCV: 79.7 fL — ABNORMAL LOW (ref 80.0–100.0)
Platelets: 281 K/uL (ref 150–400)
RBC: 5.12 MIL/uL — ABNORMAL HIGH (ref 3.87–5.11)
RDW: 14.6 % (ref 11.5–15.5)
WBC: 6.1 K/uL (ref 4.0–10.5)
nRBC: 0 % (ref 0.0–0.2)

## 2024-10-01 LAB — COMPREHENSIVE METABOLIC PANEL WITH GFR
ALT: 18 U/L (ref 0–44)
AST: 27 U/L (ref 15–41)
Albumin: 4.7 g/dL (ref 3.5–5.0)
Alkaline Phosphatase: 50 U/L (ref 38–126)
Anion gap: 13 (ref 5–15)
BUN: 8 mg/dL (ref 6–20)
CO2: 25 mmol/L (ref 22–32)
Calcium: 9.8 mg/dL (ref 8.9–10.3)
Chloride: 98 mmol/L (ref 98–111)
Creatinine, Ser: 0.78 mg/dL (ref 0.44–1.00)
GFR, Estimated: >60 mL/min (ref >60)
Glucose, Bld: 124 mg/dL — ABNORMAL HIGH (ref 70–99)
Potassium: 4 mmol/L (ref 3.5–5.1)
Sodium: 136 mmol/L (ref 135–145)
Total Bilirubin: 0.5 mg/dL (ref 0.0–1.2)
Total Protein: 7.7 g/dL (ref 6.5–8.1)

## 2024-10-01 LAB — TSH: TSH: 5.19 u[IU]/mL — ABNORMAL HIGH (ref 0.350–4.500)

## 2024-10-01 LAB — TROPONIN T, HIGH SENSITIVITY: Troponin T High Sensitivity: <15 ng/L (ref 0–19)

## 2024-10-01 LAB — D-DIMER, QUANTITATIVE: D-Dimer, Quant: <0.27 ug{FEU}/mL (ref 0.00–0.50)

## 2024-10-01 NOTE — ED Triage Notes (Signed)
 Dizziness since yesterday at church , worse today . Right and mid  chest pressure . Shortness of breath , no HTN . High BP reading at home . Denies NV .

## 2024-10-01 NOTE — Telephone Encounter (Cosign Needed)
 Called patient to inform her of her abnormal TSH encouraged to follow with PCP.  Patient voices understanding.

## 2024-10-01 NOTE — ED Provider Notes (Signed)
 [place] EMERGENCY DEPARTMENT AT [facility] Provider Note   CSN: [id number] Arrival date & time: 10/01/24  9087     Patient presents with: Dizziness   Dorothy Logan is a 43 y.o. female with history of thyroid  lobectomy who presents with complaints of chest pain, shortness of breath with associated dizziness that started yesterday while at church.  Patient reports that she has been having episodes of this since September.  Notes that her heart rate will reach up to 140 and her blood pressure will climb as well.  She has no cardiac history.  No prior history of blood clots.  These episodes come and go.  No URI symptoms.  Denies any headache or no extremity weakness or numbness.  She denies heavy alcohol use.  Admits to occasional TSH gummy.   HPI  Past Medical History:  Diagnosis Date   Anemia    Hx of goiter    Past Surgical History:  Procedure Laterality Date   CONTINUOUS NERVE MONITORING N/A 02/29/2024   Procedure: NERVE MONITORING, INTRAOPERATIVE;  Surgeon: Tobie Eldora NOVAK, MD;  Location: [facility];  Service: ENT;  Laterality: N/A;   DILATION AND CURETTAGE OF UTERUS     THYROID  LOBECTOMY Right 02/29/2024   Procedure: THYROID  LOBECTOMY, RIGHT;  Surgeon: Tobie Eldora NOVAK, MD;  Location: [facility];  Service: ENT;  Laterality: Right;       Prior to Admission medications   Medication Sig Start Date End Date Taking? Authorizing Provider  citalopram (CELEXA) 10 MG tablet Take 10 mg by mouth daily. 09/28/24  Yes [provider]  ascorbic acid (VITAMIN C) 500 MG tablet Take 500 mg by mouth daily. 11/22/22   [provider]  CANNABIDIOL PO Take 150 mg by mouth at bedtime as needed (sleep). THC GUMMY    [provider]  COLLAGEN-VITAMIN C-BIOTIN PO Take 1 Dose by mouth in the morning. 2 teaspoons    [provider]  diphenhydrAMINE  (BENADRYL ) 25 mg capsule Take 25-50 mg by mouth every 6 (six) hours as  needed (itching/allergic reaction.).    [provider]  ibuprofen  (ADVIL ) 800 MG tablet Take 0.5 tablets (400 mg total) by mouth every 6 (six) hours as needed (pain.). 03/01/24   Tobie Eldora NOVAK, MD  Magnesium 250 MG TABS Take 250 mg by mouth 3 (three) times a week.    [provider]  ondansetron  (ZOFRAN ) 4 MG tablet Take 1 tablet (4 mg total) by mouth every 8 (eight) hours as needed for nausea or vomiting. 03/01/24   Tobie Eldora NOVAK, MD    Allergies: Demerol [meperidine hcl], Latex, and Oxycodone     Review of Systems  Updated Vital Signs BP 125/83   Pulse 72   Temp 98 F (36.7 C) (Oral)   Resp 11   Wt 64.9 kg   LMP 07/04/2024 (Approximate)   SpO2 100%   BMI 29.89 kg/m   Physical Exam Vitals and nursing note reviewed.  Constitutional:      General: She is not in acute distress.    Appearance: She is well-developed.  HENT:     Head: Normocephalic and atraumatic.  Eyes:     Conjunctiva/sclera: Conjunctivae normal.  Cardiovascular:     Rate and Rhythm: Normal rate and regular rhythm.     Heart sounds: No murmur heard. Pulmonary:     Effort: Pulmonary effort is normal. No respiratory distress.     Breath sounds: Normal breath sounds.  Abdominal:  Palpations: Abdomen is soft.     Tenderness: There is no abdominal tenderness.  Musculoskeletal:        General: No swelling.     Cervical back: Neck supple.  Skin:    General: Skin is warm and dry.     Capillary Refill: Capillary refill takes less than 2 seconds.  Neurological:     Mental Status: She is alert.     Comments: Patient is alert and oriented. There is no abnormal phonation. Symmetric smile without facial droop. Moves all extremities spontaneously. 5/5 strength in upper and lower extremities. . No sensation deficit. There is no nystagmus. EOMI, PERRL. Coordination intact with finger to nose and normal ambulation.    Psychiatric:        Mood and Affect: Mood normal.     (all labs ordered are  listed, but only abnormal results are displayed) Labs Reviewed  CBC - Abnormal; Notable for the following components:      Result Value   RBC 5.12 (*)    MCV 79.7 (*)    MCH 25.8 (*)    All other components within normal limits  COMPREHENSIVE METABOLIC PANEL WITH GFR - Abnormal; Notable for the following components:   Glucose, Bld 124 (*)    All other components within normal limits  D-DIMER, QUANTITATIVE  TSH  TROPONIN T, HIGH SENSITIVITY  TROPONIN T, HIGH SENSITIVITY    EKG: EKG Interpretation Date/Time:  Monday October 01 2024 09:23:48 EDT Ventricular Rate:  91 PR Interval:  150 QRS Duration:  88 QT Interval:  351 QTC Calculation: 432 R Axis:   22  Text Interpretation: Sinus rhythm Abnormal R-wave progression, early transition Borderline T abnormalities, anterior leads Similar ECG changes to prior Confirmed by Dreama Longs ([id number]) on 10/01/2024 9:32:21 AM  Radiology: [facility] Chest Port 1 View Result Date: 10/01/2024 EXAM: 1 VIEW(S) XRAY OF THE CHEST 10/01/2024 09:55:00 AM COMPARISON: None available. CLINICAL HISTORY: CP. Dizziness, shob, cp, elevated bp that started yesterday. ; (IUD removed end of sept) FINDINGS: LUNGS AND PLEURA: No focal pulmonary opacity. No pulmonary edema. No pleural effusion. No pneumothorax. HEART AND MEDIASTINUM: No acute abnormality of the cardiac and mediastinal silhouettes. BONES AND SOFT TISSUES: No acute osseous abnormality. IMPRESSION: 1. No acute cardiopulmonary process. Electronically signed by: Helayne Hurst MD 10/01/2024 10:28 AM EDT RP Workstation: [id number]     Procedures   Medications Ordered in the ED - No data to display  Clinical Course as of 10/01/24 1224  Mon Oct 01, 2024  0950 Patient evaluated for complaints of episodic chest pain, shortness of breath with associated dizziness and nausea since September with most recent episode occurring yesterday while at church.  Upon presentation patient is hemodynamically stable.  Her exam  is benign with no neurodeficits.  Will obtain routine labs given reported tachycardia with her complaints will obtain dimer as well. [JT]  1016 CBC(!) No significant abnormality [JT]  1016 D-dimer, quantitative Within normal [JT]  1017 EKG 12-Lead Sinus rhythm, borderline T wave abnormalities appear unchanged [JT]  1032 Troponin T, High Sensitivity Within normal limit [JT]  1039 DG Chest Port 1 View No acute cardiopulmonary disease [JT]  1223 Workup is so far reassuring.  Patient is not interested in waiting for TSH.  She is asymptomatic at this time.  She will monitor her MyChart for results.  Will additionally provide her a referral for cardiology given associated tachycardia during the spells.  Strict return precautions provided.  Patient is understanding agreement plan. [JT]  Clinical Course User Index [JT] Donnajean Lynwood DEL, PA-C                                 Medical Decision Making  This patient presents to the ED with chief complaint(s) of chest pain.  The complaint involves an extensive differential diagnosis and also carries with it a high risk of complications and morbidity.   Pertinent past medical history as listed in HPI  The differential diagnosis includes  Do not suspect ACS as patient is without any EKG changes and troponins within normal limits.  Do not suspect PE as she is without any risk factors and her dimer is within normal limits.  Exam and history not consistent with CVA, TIA or subarachnoid hemorrhage.  Additional history obtained: Records reviewed Care Everywhere/External Records  Disposition:   Patient will be discharged home. The patient has been appropriately medically screened and/or stabilized in the ED. I have low suspicion for any other emergent medical condition which would require further screening, evaluation or treatment in the ED or require inpatient management. At time of discharge the patient is hemodynamically stable and in no acute distress.  I have discussed work-up results and diagnosis with patient and answered all questions. Patient is agreeable with discharge plan. We discussed strict return precautions for returning to the emergency department and they verbalized understanding.     Social Determinants of Health:   none  This note was dictated with voice recognition software.  Despite best efforts at proofreading, errors may have occurred which can change the documentation meaning.       Final diagnoses:  Dizziness    ED Discharge Orders          Ordered    Ambulatory referral to Cardiology       Comments: If you have not heard from the Cardiology office within the next 72 hours please call [phone].   10/01/24 1222               Donnajean Lynwood DEL, PA-C 10/01/24 1224    Dreama Longs, MD 10/01/24 2208

## 2024-10-01 NOTE — Discharge Instructions (Addendum)
 You were evaluated in the emergency room for dizziness.  Your lab work did not show any significant abnormality.  As discussed your thyroid  testing is still pending.  Please keep an eye on your MyChart for results.  You have additionally been provided a referral for cardiology.  You should be expecting a call for further evaluation.  Additionally recommend following the primary care doctor for further evaluation.

## 2024-10-01 NOTE — ED Notes (Signed)
 Discharge instructions reviewed with patient. Patient verbalizes understanding, no further questions at this time. Medications and follow up information provided. No acute distress noted at time of departure.

## 2024-10-02 ENCOUNTER — Ambulatory Visit

## 2024-10-02 VITALS — BP 108/64 | HR 94 | Ht <= 58 in | Wt 142.4 lb

## 2024-10-02 DIAGNOSIS — R079 Chest pain, unspecified: Secondary | ICD-10-CM

## 2024-10-02 DIAGNOSIS — R002 Palpitations: Secondary | ICD-10-CM | POA: Diagnosis not present

## 2024-10-02 MED ORDER — METOPROLOL TARTRATE 25 MG PO TABS: 25.0000 mg | ORAL_TABLET | Freq: Every day | ORAL | 3 refills | Status: AC | PRN

## 2024-10-02 NOTE — Patient Instructions (Addendum)
 Medication Instructions:  Your physician recommends that you start taking Metoprolol Tartrate 25 mg daily as needed. Please refer to the Current Medication list given to you today.  *If you need a refill on your cardiac medications before your next appointment, please call your pharmacy*  Lab Work: None ordered If you have labs (blood work) drawn today and your tests are completely normal, you will receive your results only by: MyChart Message (if you have MyChart) OR A paper copy in the mail If you have any lab test that is abnormal or we need to change your treatment, we will call you to review the results.  Testing/Procedures: Your physician has requested that you have an echocardiogram. Echocardiography is a painless test that uses sound waves to create images of your heart. It provides your doctor with information about the size and shape of your heart and how well your heart's chambers and valves are working. This procedure takes approximately one hour. There are no restrictions for this procedure. Please do NOT wear cologne, perfume, aftershave, or lotions (deodorant is allowed). Please arrive 15 minutes prior to your appointment time.  Please note: We ask at that you not bring children with you during ultrasound (echo/ vascular) testing. Due to room size and safety concerns, children are not allowed in the ultrasound rooms during exams. Our front office staff cannot provide observation of children in our lobby area while testing is being conducted. An adult accompanying a patient to their appointment will only be allowed in the ultrasound room at the discretion of the ultrasound technician under special circumstances. We apologize for any inconvenience.  A zio monitor was ordered today. It will remain on for 14 days. Remove 10/16/2024. You will then return monitor and event diary in provided box. It takes 1-2 weeks for report to be downloaded and returned to us . We will call you with the  results. If monitor falls off or has orange flashing light, please call Zio for further instructions.  Follow-Up: At [facility], you and your health needs are our priority.  As part of our continuing mission to provide you with exceptional heart care, our providers are all part of one team.  This team includes your primary Cardiologist (physician) and Advanced Practice Providers or APPs (Physician Assistants and Nurse Practitioners) who all work together to provide you with the care you need, when you need it.  Your next appointment:   2 month(s)  Provider:   Alean Kobus, MD    We recommend signing up for the patient portal called MyChart.  Sign up information is provided on this After Visit Summary.  MyChart is used to connect with patients for Virtual Visits (Telemedicine).  Patients are able to view lab/test results, encounter notes, upcoming appointments, etc.  Non-urgent messages can be sent to your provider as well.   To learn more about what you can do with MyChart, go to [email or web address].

## 2024-10-02 NOTE — Progress Notes (Signed)
 Cardiology Consultation:    Date:  10/02/2024   ID:  Dorothy Logan, DOB 1981-09-21, MRN [id number]  PCP:  Trinidad Hun, MD  Cardiologist:  Alean SAUNDERS Porfiria Heinrich, MD   Referring MD: Donnajean Lynwood DEL, PA-C   No chief complaint on file.    ASSESSMENT AND PLAN:   Dorothy Logan 43 year old woman history of thyroid  surgery [right thyroid  lobectomy 02/29/2024] for goiter, anemia.  Here for evaluation of symptoms of palpitations associated with chest discomfort.  Problem List Items Addressed This Visit     Palpitations - Primary   Intermittent palpitations, with intense symptoms, prompting 2 ER visits in the last 2 months.  No syncopal episodes. No obvious triggers. Advised to keep herself well-hydrated.  Elevated heart rates reported by Dorothy based on Dorothy smart watch alerts but no EKG tracings were recorded using Dorothy smart watch. Heart rates in ER was normal at Dorothy visits.  Demonstrated heart rate checked EKGs using Dorothy smart watch and she can upload those tracings to MyChart.  Will obtain Zio patch 14-day to assess for any significant arrhythmias. Will obtain transthoracic echocardiogram to rule out any structural and functional abnormalities.  If these results are unremarkable will obtain treadmill EKG stress test to assess for Dorothy chest discomfort symptoms.  Will prescribe metoprolol tartrate 12.5 mg to be used on an as needed basis on the days where she is feeling more frequent symptoms.        Relevant Orders   EKG 12-Lead (Completed)   ECHOCARDIOGRAM COMPLETE   LONG TERM MONITOR (3-14 DAYS)   Chest pain of uncertain etiology   Likely related to Dorothy palpitations with elevated heart rates. Will evaluate with Zio patch 14-day study. Will obtain transthoracic echocardiogram to rule out any significant structural and functional abnormalities.  If both these results are unremarkable, we will obtain a treadmill exercise stress test.      Relevant Orders    ECHOCARDIOGRAM COMPLETE   LONG TERM MONITOR (3-14 DAYS)   Return to clinic tentatively in 2 months.   History of Present Illness:    Dorothy Logan is a 43 y.o. female who is being seen today for the evaluation of palpitations and chest discomfort at the request of Donnajean Lynwood DEL, PA-C.   History of thyroid  surgery [right thyroid  lobectomy 02/29/2024] for goiter, anemia.  Here for further evaluation of symptoms of dizziness and chest dyspnea associated with elevated heart rates and blood pressure.  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  Finishing Dorothy masters course and social work and enrolled in an internship.  Lives with family has a 74 year old kid.  Was last pregnant 2 years ago complicated by loss of pregnancy and severe anemia.  Does not plan on any further pregnancies at this time.  Exercises regularly 3 times a week mostly focusing on strength training and less frequently cardio training.    She had 2 ER visits in the past 2 months. Describes as an intense episode of palpitations with a sense of heart racing while driving Dorothy car to to pick up Dorothy son.  Had to stop at a gas station and laid down on the floor.  EMS took Dorothy to the ER.  Hemodynamically stable in the ER.  Has had brief infrequent episodes since then short lasting for few seconds.  Over the weekend while at church she had an intense episode associated with elevated blood pressures and a sense of fast heartbeat.  Dorothy watch alerted Dorothy heart rates to be  elevated up to 130 bpm.  Was not able to take any EKG recordings using Dorothy smart watch.  She felt Dorothy symptoms to mild extent all day on Sunday.  Yesterday as symptoms persisted she went to the ER for evaluation.  In the emergency room she was hemodynamically stable with normal blood pressure and heart rates.  She was referred for further evaluation with cardiology. Mentions during the episodes of palpitations and elevated heart rates she feels an  intense chest pressure.  Chest pressure resolved with resolution of Dorothy palpitations.  No obvious trigger. No syncopal episodes. Does get lightheaded at times during these episodes. Denies any orthopnea, paroxysmal nocturnal dyspnea. No pedal edema.  Does not smoke cigarettes. Uses THC oral form at times. Rare alcohol consumption on social occasions. No other illicit drug use. Drinks tea or coffee occasionally.  No regular caffeinated soda consumption. Keeps herself well-hydrated.   EKG in the clinic today shows sinus rhythm heart rate 94/min, PR interval 140 ms, QRS duration 76 ms, QTc 432 ms nonspecific T wave changes lateral precordial leads.   Past Medical History:  Diagnosis Date   Anemia    Dizziness 10/01/2024   Hx of goiter    Right thyroid  nodule 02/29/2024    Past Surgical History:  Procedure Laterality Date   CONTINUOUS NERVE MONITORING N/A 02/29/2024   Procedure: NERVE MONITORING, INTRAOPERATIVE;  Surgeon: Tobie Eldora NOVAK, MD;  Location: [facility];  Service: ENT;  Laterality: N/A;   DILATION AND CURETTAGE OF UTERUS     THYROID  LOBECTOMY Right 02/29/2024   Procedure: THYROID  LOBECTOMY, RIGHT;  Surgeon: Tobie Eldora NOVAK, MD;  Location: [facility];  Service: ENT;  Laterality: Right;    Current Medications: Current Meds  Medication Sig   CANNABIDIOL PO Take 150 mg by mouth at bedtime as needed (sleep). THC GUMMY   citalopram (CELEXA) 10 MG tablet Take 10 mg by mouth daily.   diphenhydrAMINE  (BENADRYL ) 25 mg capsule Take 25-50 mg by mouth every 6 (six) hours as needed (itching/allergic reaction.).   ibuprofen  (ADVIL ) 800 MG tablet Take 0.5 tablets (400 mg total) by mouth every 6 (six) hours as needed (pain.).   Meloxicam 10 MG CAPS Take 1 tablet by mouth daily.   metoprolol tartrate (LOPRESSOR) 25 MG tablet Take 1 tablet (25 mg total) by mouth daily as needed (palpitations).     Allergies:   Demerol [meperidine hcl], Latex, and  Oxycodone    Social History   Socioeconomic History   Marital status: Widowed    Spouse name: Not on file   Number of children: Not on file   Years of education: Not on file   Highest education level: Not on file  Occupational History   Not on file  Tobacco Use   Smoking status: Never   Smokeless tobacco: Never  Vaping Use   Vaping status: Never Used  Substance and Sexual Activity   Alcohol use: Yes    Comment: socially   Drug use: Never    Comment: CBD gummies   Sexual activity: Yes    Birth control/protection: I.U.D.  Other Topics Concern   Not on file  Social History Narrative   Not on file   Social Drivers of Health   Financial Resource Strain: Not on file  Food Insecurity: Not on file  Transportation Needs: Not on file  Physical Activity: Not on file  Stress: Not on file  Social Connections: Unknown (10/12/2022)   Received from [facility]   Social  Network    Social Network: Not on file     Family History: The patient's family history includes Breast cancer in Dorothy maternal grandmother and another family member. ROS:   Please see the history of present illness.    All 14 point review of systems negative except as described per history of present illness.  EKGs/Labs/Other Studies Reviewed:    The following studies were reviewed today:   EKG:  EKG Interpretation Date/Time:  Tuesday October 02 2024 11:05:28 EDT Ventricular Rate:  94 PR Interval:  140 QRS Duration:  76 QT Interval:  346 QTC Calculation: 432 R Axis:   4  Text Interpretation: Normal sinus rhythm with sinus arrhythmia Nonspecific T wave abnormality When compared with ECG of 01-Oct-2024 09:23, PREVIOUS ECG IS PRESENT Confirmed by Liborio Hai reddy [phone]) on 10/02/2024 11:20:33 AM    Recent Labs: 10/01/2024: ALT 18; BUN 8; Creatinine, Ser 0.78; Hemoglobin 13.2; Platelets 281; Potassium 4.0; Sodium 136; TSH 5.190  Recent Lipid Panel No results found for: CHOL, TRIG, HDL,  CHOLHDL, VLDL, LDLCALC, LDLDIRECT  Physical Exam:    VS:  BP 108/64   Pulse 94   Ht 4' 10 (1.473 m)   Wt 142 lb 6.4 oz (64.6 kg)   LMP 07/04/2024 (Approximate)   SpO2 97%   BMI 29.76 kg/m     Wt Readings from Last 3 Encounters:  10/02/24 142 lb 6.4 oz (64.6 kg)  10/01/24 143 lb (64.9 kg)  03/05/24 140 lb (63.5 kg)     GENERAL:  Well nourished, well developed in no acute distress NECK: No JVD; No carotid bruits CARDIAC: RRR, S1 and S2 present, no murmurs, no rubs, no gallops CHEST:  Clear to auscultation without rales, wheezing or rhonchi  Extremities: No pitting pedal edema. Pulses bilaterally symmetric with radial 2+ and dorsalis pedis 2+ NEUROLOGIC:  Alert and oriented x 3  Medication Adjustments/Labs and Tests Ordered: Current medicines are reviewed at length with the patient today.  Concerns regarding medicines are outlined above.  Orders Placed This Encounter  Procedures   LONG TERM MONITOR (3-14 DAYS)   EKG 12-Lead   ECHOCARDIOGRAM COMPLETE   Meds ordered this encounter  Medications   metoprolol tartrate (LOPRESSOR) 25 MG tablet    Sig: Take 1 tablet (25 mg total) by mouth daily as needed (palpitations).    Dispense:  90 tablet    Refill:  3    Signed, Alias Villagran reddy Lanika Colgate, MD, MPH, [facility]. 10/02/2024 11:53 AM    [facility]

## 2024-10-02 NOTE — Assessment & Plan Note (Signed)
 Likely related to her palpitations with elevated heart rates. Will evaluate with Zio patch 14-day study. Will obtain transthoracic echocardiogram to rule out any significant structural and functional abnormalities.  If both these results are unremarkable, we will obtain a treadmill exercise stress test.

## 2024-10-02 NOTE — Assessment & Plan Note (Signed)
 Intermittent palpitations, with intense symptoms, prompting 2 ER visits in the last 2 months.  No syncopal episodes. No obvious triggers. Advised to keep herself well-hydrated.  Elevated heart rates reported by her based on her smart watch alerts but no EKG tracings were recorded using her smart watch. Heart rates in ER was normal at her visits.  Demonstrated heart rate checked EKGs using her smart watch and she can upload those tracings to MyChart.  Will obtain Zio patch 14-day to assess for any significant arrhythmias. Will obtain transthoracic echocardiogram to rule out any structural and functional abnormalities.  If these results are unremarkable will obtain treadmill EKG stress test to assess for her chest discomfort symptoms.  Will prescribe metoprolol tartrate 12.5 mg to be used on an as needed basis on the days where she is feeling more frequent symptoms.

## 2024-10-25 ENCOUNTER — Ambulatory Visit

## 2024-10-25 DIAGNOSIS — R079 Chest pain, unspecified: Secondary | ICD-10-CM

## 2024-10-25 DIAGNOSIS — R002 Palpitations: Secondary | ICD-10-CM

## 2024-10-26 LAB — ECHOCARDIOGRAM COMPLETE
Area-P 1/2: 4.36 cm2
S' Lateral: 2.4 cm

## 2024-11-02 ENCOUNTER — Ambulatory Visit: Payer: Self-pay

## 2024-11-02 DIAGNOSIS — R002 Palpitations: Secondary | ICD-10-CM

## 2024-11-02 DIAGNOSIS — R079 Chest pain, unspecified: Secondary | ICD-10-CM

## 2024-11-05 ENCOUNTER — Telehealth: Payer: Self-pay

## 2024-11-05 DIAGNOSIS — R079 Chest pain, unspecified: Secondary | ICD-10-CM

## 2024-11-05 NOTE — Addendum Note (Signed)
 Addended by: ARLOA PLANAS D on: 11/05/2024 07:51 AM   Modules accepted: Orders

## 2024-11-05 NOTE — Addendum Note (Signed)
 Addended by: LIBORIO HAI REDDY on: 11/05/2024 12:25 PM   Modules accepted: Orders

## 2024-11-05 NOTE — Telephone Encounter (Signed)
 Per Dr. Liborio, EKG stress test ordered

## 2024-11-27 ENCOUNTER — Other Ambulatory Visit (HOSPITAL_BASED_OUTPATIENT_CLINIC_OR_DEPARTMENT_OTHER): Payer: Self-pay | Admitting: Physician Assistant

## 2024-11-27 DIAGNOSIS — Z1231 Encounter for screening mammogram for malignant neoplasm of breast: Secondary | ICD-10-CM

## 2024-12-03 ENCOUNTER — Ambulatory Visit

## 2024-12-03 VITALS — BP 126/62 | HR 82 | Ht <= 58 in | Wt 147.2 lb

## 2024-12-03 DIAGNOSIS — R079 Chest pain, unspecified: Secondary | ICD-10-CM

## 2024-12-03 DIAGNOSIS — R002 Palpitations: Secondary | ICD-10-CM

## 2024-12-03 NOTE — Assessment & Plan Note (Signed)
 Reassuring results of echocardiogram and Zio patch. Reviewed with her.

## 2024-12-03 NOTE — Patient Instructions (Signed)
 Medication Instructions:  Your physician recommends that you continue on your current medications as directed. Please refer to the Current Medication list given to you today.  *If you need a refill on your cardiac medications before your next appointment, please call your pharmacy*   Lab Work: None ordered If you have labs (blood work) drawn today and your tests are completely normal, you will receive your results only by: MyChart Message (if you have MyChart) OR A paper copy in the mail If you have any lab test that is abnormal or we need to change your treatment, we will call you to review the results.   Testing/Procedures: None ordered   Follow-Up: At [facility], you and your health needs are our priority.  As part of our continuing mission to provide you with exceptional heart care, we have created designated Provider Care Teams.  These Care Teams include your primary Cardiologist (physician) and Advanced Practice Providers (APPs -  Physician Assistants and Nurse Practitioners) who all work together to provide you with the care you need, when you need it.  We recommend signing up for the patient portal called "MyChart".  Sign up information is provided on this After Visit Summary.  MyChart is used to connect with patients for Virtual Visits (Telemedicine).  Patients are able to view lab/test results, encounter notes, upcoming appointments, etc.  Non-urgent messages can be sent to your provider as well.   To learn more about what you can do with MyChart, go to [email or web address].    Your next appointment:   Follow up as needed  The format for your next appointment:   In Person  Provider:   Huntley Dec, MD    Other Instructions none  Important Information About Sugar

## 2024-12-03 NOTE — Progress Notes (Signed)
 "  Cardiology Consultation:    Date:  12/03/2024   ID:  Dorothy Logan, MRN [id number]  PCP:  Charlann Knee, PA  Cardiologist:  Alean SAUNDERS Ivette Castronova, MD   Referring MD: Trinidad Hun, MD   No chief complaint on file.    ASSESSMENT AND PLAN:   Dorothy Logan 43 year old woman with history of hyroid surgery [right thyroid  lobectomy 02/29/2024] for goiter, anemia.  Seen for palpitations and chest discomfort.  Echocardiogram from 10/25/2024 and 14-day Zio patch 10/02/2024 reassuring results as reviewed below.  Problem List Items Addressed This Visit       Other   Palpitations - Primary   Reassuring results of echocardiogram and Zio patch. Reviewed with her.       Chest pain of uncertain etiology   Proceed with treadmill EKG stress test to assess functional capacity and rule out any triggered arrhythmias as previously discussed. This is currently scheduled for 12/07/2024. Will review results once available. Follow-up with us  on an as-needed basis if this test is reassuring.      History of Present Illness:    Dorothy Logan is a 43 y.o. female who is being seen today for follow-up visit. PCP is Trinidad Hun, MD. Last visit with me in the office was 10/02/2024.  History of thyroid  surgery [right thyroid  lobectomy 02/29/2024] for goiter, anemia.  Seen for palpitations and chest discomfort.  Echocardiogram from 10/25/2024 noted normal biventricular function with LVEF 60 to 65%, normal diastolic function, no significant valve abnormalities. Long-term heart monitor 10/02/2024 14-day study noted average heart rate 82/min [ranging from 56 to 136 bpm], rare ventricular and supraventricular ectopy burden, no significant arrhythmias, total patient triggered events and diary entries 8 correlated with normal heart rate and rhythm and at times with mild sinus tachycardia.  Treadmill EKG stress test was recommended given the above 2 results to be normal to rule out any  triggered arrhythmias and assess functional capacity.  This is currently scheduled for 12/07/2024  Mentions overall she has been doing well. No active symptoms. Able to workout. Tends to have intermittent episodes of short palpitations lasting few seconds at most. Has not required to take any metoprolol .  Using preworkout drinks which have some caffeine.  Past Medical History:  Diagnosis Date   Anemia    Chest pain of uncertain etiology 10/02/2024   Dizziness 10/01/2024   Hx of goiter    Palpitations 10/02/2024   Right thyroid  nodule 02/29/2024    Past Surgical History:  Procedure Laterality Date   CONTINUOUS NERVE MONITORING N/A 02/29/2024   Procedure: NERVE MONITORING, INTRAOPERATIVE;  Surgeon: Tobie Eldora NOVAK, MD;  Location: [facility];  Service: ENT;  Laterality: N/A;   DILATION AND CURETTAGE OF UTERUS     THYROID  LOBECTOMY Right 02/29/2024   Procedure: THYROID  LOBECTOMY, RIGHT;  Surgeon: Tobie Eldora NOVAK, MD;  Location: [facility];  Service: ENT;  Laterality: Right;    Current Medications: Active Medications[1]   Allergies:   Demerol [meperidine hcl], Latex, and Oxycodone    Social History   Socioeconomic History   Marital status: Widowed    Spouse name: Not on file   Number of children: Not on file   Years of education: Not on file   Highest education level: Not on file  Occupational History   Not on file  Tobacco Use   Smoking status: Never   Smokeless tobacco: Never  Vaping Use   Vaping status: Never Used  Substance and Sexual Activity  Alcohol use: Yes    Comment: socially   Drug use: Never    Comment: CBD gummies   Sexual activity: Yes    Birth control/protection: I.U.D.  Other Topics Concern   Not on file  Social History Narrative   Not on file   Social Drivers of Health   Tobacco Use: Low Risk (12/03/2024)   Patient History    Smoking Tobacco Use: Never    Smokeless Tobacco Use: Never    Passive Exposure: Not on  file  Financial Resource Strain: Not on file  Food Insecurity: Not on file  Transportation Needs: Not on file  Physical Activity: Not on file  Stress: Not on file  Social Connections: Not on file  Depression (EYV7-0): Not on file  Alcohol Screen: Not on file  Housing: Not on file  Utilities: Not on file  Health Literacy: Not on file     Family History: The patient's family history includes Breast cancer in her maternal grandmother and another family member. ROS:   Please see the history of present illness.    All 14 point review of systems negative except as described per history of present illness.  EKGs/Labs/Other Studies Reviewed:    The following studies were reviewed today:   EKG:       Recent Labs: 10/01/2024: ALT 18; BUN 8; Creatinine, Ser 0.78; Hemoglobin 13.2; Platelets 281; Potassium 4.0; Sodium 136; TSH 5.190  Recent Lipid Panel No results found for: CHOL, TRIG, HDL, CHOLHDL, VLDL, LDLCALC, LDLDIRECT  Physical Exam:    VS:  BP 126/62   Pulse 82   Ht 4' 10 (1.473 m)   Wt 147 lb 4 oz (66.8 kg)   SpO2 98%   BMI 30.78 kg/m     Wt Readings from Last 3 Encounters:  12/03/24 147 lb 4 oz (66.8 kg)  10/02/24 142 lb 6.4 oz (64.6 kg)  10/01/24 143 lb (64.9 kg)     GENERAL:  Well nourished, well developed in no acute distress NECK: No JVD CARDIAC: RRR, S1 and S2 present, no murmurs, no rubs, no gallops NEUROLOGIC:  Alert and oriented x 3  Medication Adjustments/Labs and Tests Ordered: Current medicines are reviewed at length with the patient today.  Concerns regarding medicines are outlined above.  No orders of the defined types were placed in this encounter.  No orders of the defined types were placed in this encounter.   Signed, Clemencia Helzer reddy Saiya Crist, MD, MPH, [facility]. 12/03/2024 9:57 AM    [facility]    [1]  Current Meds  Medication Sig   ascorbic acid (VITAMIN C) 500 MG tablet Take 500 mg by mouth daily.    CANNABIDIOL PO Take 150 mg by mouth at bedtime as needed (sleep). THC GUMMY   COLLAGEN-VITAMIN C-BIOTIN PO Take 1 Dose by mouth in the morning. 2 teaspoons   diphenhydrAMINE  (BENADRYL ) 25 mg capsule Take 25-50 mg by mouth every 6 (six) hours as needed (itching/allergic reaction.).   ibuprofen  (ADVIL ) 800 MG tablet Take 0.5 tablets (400 mg total) by mouth every 6 (six) hours as needed (pain.).   Magnesium 250 MG TABS Take 250 mg by mouth 3 (three) times a week.   metoprolol  tartrate (LOPRESSOR ) 25 MG tablet Take 1 tablet (25 mg total) by mouth daily as needed (palpitations).   metroNIDAZOLE (FLAGYL) 500 MG tablet Take 500 mg by mouth 2 (two) times daily.   "

## 2024-12-03 NOTE — Assessment & Plan Note (Addendum)
 Proceed with treadmill EKG stress test to assess functional capacity and rule out any triggered arrhythmias as previously discussed. This is currently scheduled for 12/07/2024. Will review results once available. Follow-up with us  on an as-needed basis if this test is reassuring.

## 2024-12-07 ENCOUNTER — Ambulatory Visit

## 2024-12-26 ENCOUNTER — Encounter (HOSPITAL_BASED_OUTPATIENT_CLINIC_OR_DEPARTMENT_OTHER): Payer: Self-pay

## 2024-12-26 ENCOUNTER — Ambulatory Visit (HOSPITAL_BASED_OUTPATIENT_CLINIC_OR_DEPARTMENT_OTHER)
Admission: RE | Admit: 2024-12-26 | Discharge: 2024-12-26 | Disposition: A | Discharge status: Home / Self Care | Source: Ambulatory Visit | Attending: Physician Assistant | Admitting: Physician Assistant

## 2024-12-26 DIAGNOSIS — Z1231 Encounter for screening mammogram for malignant neoplasm of breast: Secondary | ICD-10-CM | POA: Diagnosis present

## 2024-12-28 ENCOUNTER — Ambulatory Visit

## 2024-12-28 ENCOUNTER — Ambulatory Visit: Payer: Self-pay

## 2024-12-28 DIAGNOSIS — R079 Chest pain, unspecified: Secondary | ICD-10-CM

## 2024-12-28 LAB — EXERCISE TOLERANCE TEST
Angina Index: 0
Base ST Depression (mm): 0 mm
Duke Treadmill Score: 10
Estimated workload: 13.4
Exercise duration (min): 10 min
Exercise duration (sec): 8 s
MPHR: 178 {beats}/min
Peak HR: 176 {beats}/min
Percent HR: 98 %
RPE: 15
Rest HR: 71 {beats}/min
ST Depression (mm): 0 mm
